# Patient Record
Sex: Male | Born: 1935 | Race: White | Hispanic: No | Marital: Married | Smoking: Former smoker
Health system: Southern US, Community
[De-identification: ages and names within clinical notes are randomized; demographics above are authoritative.]

## PROBLEM LIST (undated history)

## (undated) DIAGNOSIS — R918 Other nonspecific abnormal finding of lung field: Secondary | ICD-10-CM

## (undated) DIAGNOSIS — C801 Malignant (primary) neoplasm, unspecified: Secondary | ICD-10-CM

## (undated) DIAGNOSIS — C787 Secondary malignant neoplasm of liver and intrahepatic bile duct: Principal | ICD-10-CM

## (undated) DIAGNOSIS — F0390 Unspecified dementia without behavioral disturbance: Secondary | ICD-10-CM

## (undated) HISTORY — DX: Malignant (primary) neoplasm, unspecified: C80.1

## (undated) HISTORY — DX: Secondary malignant neoplasm of liver and intrahepatic bile duct: C78.7

## (undated) HISTORY — DX: Unspecified dementia, unspecified severity, without behavioral disturbance, psychotic disturbance, mood disturbance, and anxiety: F03.90

## (undated) HISTORY — DX: Other nonspecific abnormal finding of lung field: R91.8

## (undated) HISTORY — PX: CHOLECYSTECTOMY: SHX55

---

## 2000-02-24 ENCOUNTER — Emergency Department (HOSPITAL_COMMUNITY): Admission: EM | Admit: 2000-02-24 | Discharge: 2000-02-24 | Payer: Self-pay | Admitting: Emergency Medicine

## 2000-03-03 ENCOUNTER — Encounter (INDEPENDENT_AMBULATORY_CARE_PROVIDER_SITE_OTHER): Payer: Self-pay | Admitting: Specialist

## 2000-03-03 ENCOUNTER — Other Ambulatory Visit: Admission: RE | Admit: 2000-03-03 | Discharge: 2000-03-03 | Payer: Self-pay | Admitting: Urology

## 2000-03-13 ENCOUNTER — Emergency Department (HOSPITAL_COMMUNITY): Admission: EM | Admit: 2000-03-13 | Discharge: 2000-03-13 | Payer: Self-pay | Admitting: Emergency Medicine

## 2009-09-15 ENCOUNTER — Ambulatory Visit: Admission: RE | Admit: 2009-09-15 | Discharge: 2009-11-10 | Payer: Self-pay | Admitting: Radiation Oncology

## 2012-08-14 ENCOUNTER — Encounter: Payer: Self-pay | Admitting: Family Medicine

## 2012-08-14 DIAGNOSIS — F0390 Unspecified dementia without behavioral disturbance: Secondary | ICD-10-CM | POA: Insufficient documentation

## 2012-08-22 ENCOUNTER — Ambulatory Visit (INDEPENDENT_AMBULATORY_CARE_PROVIDER_SITE_OTHER): Payer: Medicare Other | Admitting: Family Medicine

## 2012-08-22 ENCOUNTER — Encounter: Payer: Self-pay | Admitting: Family Medicine

## 2012-08-22 VITALS — BP 130/72 | HR 84 | Temp 98.2°F | Resp 16 | Wt 179.0 lb

## 2012-08-22 DIAGNOSIS — F0391 Unspecified dementia with behavioral disturbance: Secondary | ICD-10-CM

## 2012-08-22 LAB — HEPATIC FUNCTION PANEL
ALT: 28 U/L (ref 0–53)
Alkaline Phosphatase: 74 U/L (ref 39–117)
Bilirubin, Direct: 0.1 mg/dL (ref 0.0–0.3)
Indirect Bilirubin: 0.4 mg/dL (ref 0.0–0.9)
Total Protein: 6.7 g/dL (ref 6.0–8.3)

## 2012-08-22 LAB — BASIC METABOLIC PANEL
CO2: 29 mEq/L (ref 19–32)
Calcium: 9.8 mg/dL (ref 8.4–10.5)
Chloride: 109 mEq/L (ref 96–112)
Sodium: 145 mEq/L (ref 135–145)

## 2012-08-22 LAB — CBC WITH DIFFERENTIAL/PLATELET
Lymphocytes Relative: 26 % (ref 12–46)
Lymphs Abs: 1.7 10*3/uL (ref 0.7–4.0)
Neutrophils Relative %: 67 % (ref 43–77)
Platelets: 195 10*3/uL (ref 150–400)
RBC: 5.02 MIL/uL (ref 4.22–5.81)
WBC: 6.6 10*3/uL (ref 4.0–10.5)

## 2012-08-22 NOTE — Progress Notes (Signed)
Subjective:     Patient ID: Adam Osborne, male   DOB: Feb 11, 1934, 79 y.o.   MRN: [id number]  HPI Patient presents with his wife and his daughter.  They're concerned because of an acute worsening and his dementia.  He he can no longer remember his children's names or his wife's name.  He cannot remember to eat or feed himself.  He has a hard time following directions.  He's been found using the bathroom outdoors.  He no longer knows the date, the time, or the context of the situation.   This is recently worsened.   He occasionally hallucinates people in house.  Wife also reports OCD tendencies regarding making his bed in his bath time ritual.  His wife has to help get ready in the mornings. She then leads to go to work where he remains at home unsupervised until lunchtime when his daughter comes by to feed him lunch. His wife actually home after lunchtime. The patient no longer drives and basically stays confined to the house when no one else is there.   Review of Systems  Constitutional: Negative for fever, chills, diaphoresis, activity change, appetite change, fatigue and unexpected weight change.  HENT: Negative.   Eyes: Negative.   Respiratory: Negative.   Cardiovascular: Negative.   Gastrointestinal: Negative.   Endocrine: Negative.   Genitourinary: Negative.   Neurological: Positive for speech difficulty. Negative for dizziness, tremors, seizures, syncope, facial asymmetry, weakness, light-headedness, numbness and headaches.  Hematological: Negative.   Psychiatric/Behavioral: Positive for hallucinations, confusion and decreased concentration. Negative for behavioral problems, sleep disturbance, self-injury, dysphoric mood and agitation. The patient is not nervous/anxious and is not hyperactive.        Objective:   Physical Exam  Constitutional: He appears well-developed and well-nourished. No distress.  HENT:  Head: Normocephalic and atraumatic.  Right Ear: External ear normal.   Left Ear: External ear normal.  Nose: Nose normal.  Mouth/Throat: Oropharynx is clear and moist.  Eyes: Conjunctivae and EOM are normal. Pupils are equal, round, and reactive to light.  Neck: Normal range of motion. Neck supple. No JVD present. No thyromegaly present.  Cardiovascular: Normal rate, regular rhythm and normal heart sounds.   No murmur heard. Pulmonary/Chest: Effort normal and breath sounds normal. No respiratory distress. He has no wheezes. He has no rales. He exhibits no tenderness.  Abdominal: Soft. Bowel sounds are normal. He exhibits no distension and no mass. There is no tenderness. There is no rebound and no guarding.  Musculoskeletal: Normal range of motion.  Lymphadenopathy:    He has no cervical adenopathy.  Neurological: He is alert. He has normal strength and normal reflexes. He is disoriented. He displays no atrophy and no tremor. No cranial nerve deficit or sensory deficit. He exhibits normal muscle tone. He displays a negative Romberg sign. Coordination and gait normal.  MMSE-9/30   Skin: He is not diaphoretic.       Assessment:     Moderate to severe dementia Subacute delirium    Plan:     Check a CBC, CMP, TSH B12. MRI of the brain Await results of the studies to determine the next step.  He may benefit from exelon patch.

## 2012-08-28 ENCOUNTER — Telehealth: Payer: Self-pay | Admitting: Family Medicine

## 2012-08-28 MED ORDER — DIAZEPAM 5 MG PO TABS: 5.0000 mg | ORAL_TABLET | ORAL | Status: DC

## 2012-08-28 NOTE — Telephone Encounter (Signed)
Can we call out valium 5 mg po x 1 30 min before mri

## 2012-08-28 NOTE — Telephone Encounter (Signed)
Med c/o pt's wife aware

## 2012-08-29 ENCOUNTER — Other Ambulatory Visit: Payer: Self-pay | Admitting: Family Medicine

## 2012-08-29 MED ORDER — DIAZEPAM 5 MG PO TABS: 5.0000 mg | ORAL_TABLET | ORAL | Status: DC

## 2012-09-01 ENCOUNTER — Inpatient Hospital Stay: Admission: RE | Admit: 2012-09-01 | Payer: Self-pay | Source: Ambulatory Visit

## 2012-09-06 ENCOUNTER — Telehealth: Payer: Self-pay | Admitting: Family Medicine

## 2012-09-07 MED ORDER — RIVASTIGMINE 4.6 MG/24HR TD PT24: 1.0000 | MEDICATED_PATCH | Freq: Every day | TRANSDERMAL | Status: AC

## 2012-09-07 MED ORDER — HALOPERIDOL 0.5 MG PO TABS: 0.5000 mg | ORAL_TABLET | Freq: Every evening | ORAL | Status: DC | PRN

## 2012-09-07 NOTE — Telephone Encounter (Signed)
Did he get MRI?  We could try haldol at night for agitation and start exelon patches to slow dementia.

## 2012-09-07 NOTE — Telephone Encounter (Signed)
Pt aware.

## 2012-09-08 ENCOUNTER — Ambulatory Visit
Admission: RE | Admit: 2012-09-08 | Discharge: 2012-09-08 | Disposition: A | Discharge status: Home / Self Care | Payer: Medicare Other | Source: Ambulatory Visit | Attending: Family Medicine | Admitting: Family Medicine

## 2012-09-08 MED ORDER — GADOBENATE DIMEGLUMINE 529 MG/ML IV SOLN
17.0000 mL | Freq: Once | INTRAVENOUS | Status: AC | PRN
  Administered 2012-09-08: 17 mL via INTRAVENOUS

## 2012-09-20 ENCOUNTER — Ambulatory Visit (INDEPENDENT_AMBULATORY_CARE_PROVIDER_SITE_OTHER): Payer: Medicare Other | Admitting: Family Medicine

## 2012-09-20 ENCOUNTER — Encounter: Payer: Self-pay | Admitting: Family Medicine

## 2012-09-20 VITALS — BP 130/74 | HR 78 | Temp 98.0°F | Resp 14 | Wt 177.0 lb

## 2012-09-20 DIAGNOSIS — M25552 Pain in left hip: Secondary | ICD-10-CM

## 2012-09-20 DIAGNOSIS — M25559 Pain in unspecified hip: Secondary | ICD-10-CM

## 2012-09-20 DIAGNOSIS — F0391 Unspecified dementia with behavioral disturbance: Secondary | ICD-10-CM

## 2012-09-20 MED ORDER — MELOXICAM 15 MG PO TABS: 15.0000 mg | ORAL_TABLET | Freq: Every day | ORAL | Status: DC

## 2012-09-20 NOTE — Progress Notes (Signed)
Subjective:     Patient ID: Adam Osborne, male   DOB: 28-Jan-1934, 79 y.o.   MRN: [id number]  Hip Pain  Pertinent negatives include no numbness.   Patient presents with his wife and his daughter.  They're concerned because of an acute worsening and his dementia.  He he can no longer remember his children's names or his wife's name.  He cannot remember to eat or feed himself.  He has a hard time following directions.  He's been found using the bathroom outdoors.  He no longer knows the date, the time, or the context of the situation.   This is recently worsened.   He occasionally hallucinates people in house.  Wife also reports OCD tendencies regarding making his bed in his bath time ritual.  His wife has to help get ready in the mornings. She then leads to go to work where he remains at home unsupervised until lunchtime when his daughter comes by to feed him lunch. His wife actually home after lunchtime. The patient no longer drives and basically stays confined to the house when no one else is there.   09/20/12 We ordered an MRI and some basic laboratory studies. MRI showed age-related atrophic changes but no CVA or brain tumor. Therefore I recommended transdermal exelon 4.6 mg/24-hour and Haldol 0.5 mg to be used when necessary for agitation or delirium. The right is very concerned about using Haldol. She is willing to try the Exelon. We did discuss his previous allergies to Aricept, but I emphasized that if we can prevent the dimentia from rapidly deteriorating, that gives her the best chance of keeping him at home.  Is also concerned the patient could be depressed. She states that his symptoms really worsen after the death of his brother.  He also complains of pain in his left hip with flexion. Review of Systems  Constitutional: Negative for fever, chills, diaphoresis, activity change, appetite change, fatigue and unexpected weight change.  HENT: Negative.   Eyes: Negative.   Respiratory:  Negative.   Cardiovascular: Negative.   Gastrointestinal: Negative.   Endocrine: Negative.   Genitourinary: Negative.   Neurological: Positive for speech difficulty. Negative for dizziness, tremors, seizures, syncope, facial asymmetry, weakness, light-headedness, numbness and headaches.  Hematological: Negative.   Psychiatric/Behavioral: Positive for hallucinations, confusion and decreased concentration. Negative for behavioral problems, sleep disturbance, self-injury, dysphoric mood and agitation. The patient is not nervous/anxious and is not hyperactive.        Objective:   Physical Exam  Constitutional: He appears well-developed and well-nourished. No distress.  HENT:  Head: Normocephalic and atraumatic.  Right Ear: External ear normal.  Left Ear: External ear normal.  Nose: Nose normal.  Mouth/Throat: Oropharynx is clear and moist.  Eyes: Conjunctivae and EOM are normal. Pupils are equal, round, and reactive to light.  Neck: Normal range of motion. Neck supple. No JVD present. No thyromegaly present.  Cardiovascular: Normal rate, regular rhythm and normal heart sounds.   No murmur heard. Pulmonary/Chest: Effort normal and breath sounds normal. No respiratory distress. He has no wheezes. He has no rales. He exhibits no tenderness.  Abdominal: Soft. Bowel sounds are normal. He exhibits no distension and no mass. There is no tenderness. There is no rebound and no guarding.  Musculoskeletal: Normal range of motion.  Lymphadenopathy:    He has no cervical adenopathy.  Neurological: He is alert. He has normal strength and normal reflexes. He is disoriented. He displays no atrophy and no tremor. No cranial nerve deficit  or sensory deficit. He exhibits normal muscle tone. He displays a negative Romberg sign. Coordination and gait normal.  MMSE-9/30   Skin: He is not diaphoretic.    on hip exam he has pain with Faber maneuver and tenderness to palpation of the greater trochanter bursa     Assessment:     Moderate to severe dementia Subacute delirium   Hip pain Plan:      Continue Namenda.  Exelon patch 4.6 mg per 24-hour transdermal.  I asked her to give one month to assess for any improvement. If symptoms do not improve we can consider empiric treatment for possible depression with an SSRI.  She is to call me with results. I will treat his hip pain empirically as arthritis with Mobic 15 mg by mouth daily. Given his history of prostate cancer, if the pain persists would recommend getting an x-ray to rule out possible bone mets.

## 2012-09-28 ENCOUNTER — Telehealth: Payer: Self-pay | Admitting: Family Medicine

## 2012-09-28 DIAGNOSIS — M25552 Pain in left hip: Secondary | ICD-10-CM

## 2012-09-28 NOTE — Telephone Encounter (Signed)
i put in the order for [facility] imaging

## 2012-09-28 NOTE — Telephone Encounter (Signed)
Patient aware.

## 2012-09-28 NOTE — Telephone Encounter (Signed)
Pt wife states meloxicam is not working for him anymore; she has taken him off of it and started on motrin. Wants to know if you want to do xrays like you have suggested before?

## 2012-09-29 ENCOUNTER — Telehealth: Payer: Self-pay | Admitting: Family Medicine

## 2012-09-29 MED ORDER — MEMANTINE HCL ER 28 MG PO CP24: 1.0000 | ORAL_CAPSULE | Freq: Every day | ORAL | Status: DC

## 2012-09-29 NOTE — Telephone Encounter (Signed)
Rx Refilled  

## 2012-09-29 NOTE — Telephone Encounter (Signed)
Pt wife states that drug company no longer provides regular namenda anymore but can provide Namenda XR in 7, 14, 21, 28 mg. Pt is taking Namenda 10 mg twice a day. What mg can we change it to?

## 2012-09-29 NOTE — Telephone Encounter (Signed)
namenda xr 28 mg poqday

## 2012-10-09 ENCOUNTER — Ambulatory Visit
Admission: RE | Admit: 2012-10-09 | Discharge: 2012-10-09 | Disposition: A | Discharge status: Home / Self Care | Payer: Medicare Other | Source: Ambulatory Visit | Attending: Family Medicine | Admitting: Family Medicine

## 2012-10-09 DIAGNOSIS — M25552 Pain in left hip: Secondary | ICD-10-CM

## 2012-10-10 NOTE — Progress Notes (Signed)
Wants to stay on Mobic d/t is working for pt and says pain is better now

## 2012-10-18 ENCOUNTER — Encounter: Payer: Self-pay | Admitting: Physician Assistant

## 2012-10-18 ENCOUNTER — Ambulatory Visit (INDEPENDENT_AMBULATORY_CARE_PROVIDER_SITE_OTHER): Payer: Medicare Other | Admitting: Physician Assistant

## 2012-10-18 VITALS — BP 134/70 | HR 80 | Temp 98.1°F | Resp 18 | Ht 69.75 in | Wt 176.0 lb

## 2012-10-18 DIAGNOSIS — W57XXXA Bitten or stung by nonvenomous insect and other nonvenomous arthropods, initial encounter: Secondary | ICD-10-CM

## 2012-10-18 MED ORDER — DOXYCYCLINE HYCLATE 100 MG PO TABS: 100.0000 mg | ORAL_TABLET | Freq: Two times a day (BID) | ORAL | Status: DC

## 2012-10-18 NOTE — Progress Notes (Signed)
Patient ID: Adam Osborne MRN: [id number], DOB: 06-Mar-1934, 79 y.o. Date of Encounter: 10/18/2012, 12:55 PM    Chief Complaint:  Chief Complaint  Patient presents with  . ?? bite mark on lower back     HPI: 79 y.o. year old male is here with his wife. He has dementia. She provides history. States that she noticed this area on his skin 4 days ago when she did his bath.  Says he goes outside a lot and had been outside the previous day. Thinks he had a tic there but she never saw a tic-thinks he had scratched it off. Area has gotten darker/ more pronounced so wanted to have it checked.  Has seen no other rash. He has had no fever. He has not c/o myalgias. He has had no malaise, not laying aroung with flu-like symptoms.   Home Meds: See attached medication section for any medications that were entered at today's visit. The computer does not put those onto this list.The following list is a list of meds entered prior to today's visit.   Current Outpatient Prescriptions on File Prior to Visit  Medication Sig Dispense Refill  . ibuprofen (ADVIL,MOTRIN) 600 MG tablet Take 600 mg by mouth every 6 (six) hours as needed for pain.      . Memantine HCl ER (NAMENDA XR) 28 MG CP24 Take 28 mg by mouth daily.  30 capsule  5  . Multiple Vitamins-Minerals (MULTIVITAMIN WITH MINERALS) tablet Take 1 tablet by mouth daily.      . rivastigmine (EXELON) 4.6 mg/24hr Place 1 patch (4.6 mg total) onto the skin daily.  30 patch  12  . haloperidol (HALDOL) 0.5 MG tablet Take 1 tablet (0.5 mg total) by mouth at bedtime as needed.  30 tablet  3  . meloxicam (MOBIC) 15 MG tablet Take 1 tablet (15 mg total) by mouth daily.  30 tablet  1   No current facility-administered medications on file prior to visit.    Allergies:  Allergies  Allergen Reactions  . Aricept (Donepezil Hcl)     Dizziness Vivid dreams       Review of Systems: See HPI for pertinent ROS. All other ROS negative.    Physical Exam: Blood  pressure 134/70, pulse 80, temperature 98.1 F (36.7 C), temperature source Oral, resp. rate 18, height 5' 9.75" (1.772 m), weight 176 lb (79.833 kg)., Body mass index is 25.42 kg/(m^2). General: WNWD WM.  Appears in no acute distress. He has dementia.  Lungs: Clear bilaterally to auscultation without wheezes, rales, or rhonchi. Breathing is unlabored. Heart: Regular rhythm. No murmurs, rubs, or gallops. Msk:  Strength and tone normal for age. Skin: Left low back at level of pant waist line: There is a 1 1/2 inch x 1 inch oval shape area of diffuse -dark erythema/slightly purplish. There is no target lesion. No area of clearing. In the center, there is a 3 mm scab area, c/w a bite mark.  No necrosis of skin. Palpation: no firmness/no abscess. No drainage. Site is flat, soft.      ASSESSMENT AND PLAN:  79 y.o. year old male with  1. Tick bite Probable tic bite. Rec start empiric treatment for Lyme. Discussed titers. Wife defers. Agrees to go ahead and start treatment. - doxycycline (VIBRA-TABS) 100 MG tablet; Take 1 tablet (100 mg total) by mouth 2 (two) times daily.  Dispense: 28 tablet; Refill: 0  F/U if develops further rash or any fever, myalgia, malaise.   Leanora Ivanoff  [place], [place], [facility] 10/18/2012 12:55 PM

## 2012-11-27 ENCOUNTER — Encounter: Payer: Self-pay | Admitting: Family Medicine

## 2012-11-27 ENCOUNTER — Ambulatory Visit (INDEPENDENT_AMBULATORY_CARE_PROVIDER_SITE_OTHER): Payer: Medicare Other | Admitting: Family Medicine

## 2012-11-27 VITALS — BP 122/60 | HR 68 | Temp 98.1°F | Resp 20 | Wt 175.0 lb

## 2012-11-27 DIAGNOSIS — F03918 Unspecified dementia, unspecified severity, with other behavioral disturbance: Secondary | ICD-10-CM

## 2012-11-27 DIAGNOSIS — F0391 Unspecified dementia with behavioral disturbance: Secondary | ICD-10-CM

## 2012-11-27 MED ORDER — QUETIAPINE FUMARATE 25 MG PO TABS: 25.0000 mg | ORAL_TABLET | Freq: Every day | ORAL | Status: DC

## 2012-11-27 NOTE — Patient Instructions (Signed)
Seroquel at bedtime for sleep and sundowning I prefer not to use valium due to risk of fall and dizziness Let us know if you have any concerns

## 2012-11-27 NOTE — Progress Notes (Signed)
  Subjective:    Patient ID: Adam Osborne, male    DOB: 06/26/1933, 79 y.o.   MRN: [id number]  HPI  Patient here with wife. He has a history of moderately to severe dementia he is currently on Namenda and Exelon patch. Wife is concerned because of sundowning and difficulty getting him to sleep. He has had 3 episodes of the past couple weeks where he is getting up in the middle of the night sitting on his close pressure in his feet trying to leave out the room. She has to redirect him however he would get undressed and then dress himself again 30 minutes to an hour later and he is not sleeping. He did get agitated with her however she was able to redirect him and he did not cause her any physical harm. During the day he does not seem to have any difficulties. He is still eating well and is doing well with his sitters to come twice a week. He was given a prescription for Haldol however she remembers her mother being on this medication and the side effects that may cause after discussing with the family decided not to give him this medication. She did inquire about the use of Valium.  Review of Systems - per above  unable to obtain complete GEN- denies fatigue, fever, weight loss,weakness, recent illness CVS- denies chest pain, palpitations RESP- denies SOB, cough, wheeze MSK- denies joint pain, muscle aches, injury Neuro- denies headache, dizziness, syncope, seizure activity       Objective:   Physical Exam  GEN- NAD, alert and oriented x 2 sitting quietly HEENT- PERRL, EOMI, non injected sclera, pink conjunctiva, MMM, oropharynx clear, wears hearing aide CVS- RRR, no murmur RESP-CTAB EXT- No edema Pulses- Radial, DP- 2+ Psych- responds to some questions, sitting quietly starting off, not agitated appearing       Assessment & Plan:

## 2012-11-28 NOTE — Assessment & Plan Note (Signed)
Now with some changes in behavior and sundowning, she has tried re-directing but he is getting agitated Wife fearful of use of haldol Will try seroquel at bedtime low dose

## 2013-01-31 ENCOUNTER — Telehealth: Payer: Self-pay | Admitting: Family Medicine

## 2013-01-31 NOTE — Telephone Encounter (Signed)
Namenda XR is on backorder. Can we call in something to replace it?

## 2013-02-01 NOTE — Telephone Encounter (Signed)
1 month of samples on my desk

## 2013-02-01 NOTE — Telephone Encounter (Signed)
pts wife aware.

## 2013-02-26 ENCOUNTER — Encounter: Payer: Self-pay | Admitting: Family Medicine

## 2013-02-26 ENCOUNTER — Ambulatory Visit (INDEPENDENT_AMBULATORY_CARE_PROVIDER_SITE_OTHER): Payer: Medicare Other | Admitting: Family Medicine

## 2013-02-26 VITALS — BP 130/60 | HR 98 | Temp 98.3°F | Resp 18 | Wt 186.0 lb

## 2013-02-26 DIAGNOSIS — F0391 Unspecified dementia with behavioral disturbance: Secondary | ICD-10-CM

## 2013-02-26 DIAGNOSIS — G2401 Drug induced subacute dyskinesia: Secondary | ICD-10-CM

## 2013-02-26 NOTE — Progress Notes (Signed)
Subjective:    Patient ID: Adam Osborne, male    DOB: Oct 23, 1933, 79 y.o.   MRN: [id number]  HPI  Patient is a very pleasant 79 year old white male is here today for followup. He has end-stage dementia. He is currently on Namenda XR 28 mg by mouth daily and exelon Patches 4.6 mg per 24 hours.  Due to agitation and sundowning we began Seroquel 25 mg by mouth each bedtime. Since starting the medication he is not as agitated and he is calmer at night. He is still not sleeping throughout the night. He roams the halls at the home. This keeps his wife awake.  She also thinks he's developing dry mouth from the medication and she is frequently licking his lips at night. However the way she describes the movements it sounds like possibly tardive dyskinesia. He's also developed a shuffling gait and a pill-rolling tremor at times. She's not sure if this is due to the medication. Past Medical History  Diagnosis Date  . Dementia   . Cancer     Gleason 6 prostate cancer  . Dementia    Current Outpatient Prescriptions on File Prior to Visit  Medication Sig Dispense Refill  . Memantine HCl ER (NAMENDA XR) 28 MG CP24 Take 28 mg by mouth daily.  30 capsule  5  . Multiple Vitamins-Minerals (MULTIVITAMIN WITH MINERALS) tablet Take 1 tablet by mouth daily.      . QUEtiapine (SEROQUEL) 25 MG tablet Take 1 tablet (25 mg total) by mouth at bedtime.  30 tablet  3  . rivastigmine (EXELON) 4.6 mg/24hr Place 1 patch (4.6 mg total) onto the skin daily.  30 patch  12   No current facility-administered medications on file prior to visit.   Allergies  Allergen Reactions  . Aricept [Donepezil Hcl]     Dizziness Vivid dreams    History   Social History  . Marital Status: Married    Spouse Name: N/A    Number of Children: N/A  . Years of Education: N/A   Occupational History  . Not on file.   Social History Main Topics  . Smoking status: Former Smoker -- 1.00 packs/day for 20 years  . Smokeless tobacco:  Never Used  . Alcohol Use: No  . Drug Use: Not on file  . Sexual Activity: Not on file   Other Topics Concern  . Not on file   Social History Narrative  . No narrative on file    Review of Systems  All other systems reviewed and are negative.       Objective:   Physical Exam  Vitals reviewed. Cardiovascular: Normal rate, regular rhythm and normal heart sounds.  Exam reveals no gallop and no friction rub.   No murmur heard. Pulmonary/Chest: Effort normal and breath sounds normal. No respiratory distress. He has no wheezes. He has no rales. He exhibits no tenderness.  Abdominal: Soft. Bowel sounds are normal. He exhibits no distension. There is no tenderness. There is no rebound and no guarding.  Neurological: He is alert. He displays normal reflexes. No cranial nerve deficit. He exhibits normal muscle tone. Coordination normal.          Assessment & Plan:  1. Dementia, with behavioral disturbance The biggest issue for the patient right now is his delirium at night and his sundowning. Therefore I increased his Zocor to 50 mg by mouth each bedtime. I anticipate that this will likely worsen his tardive dyskinesia. This may also worsen some of the Parkinsonian  features and he is developing. If symptoms continue to worsen, I would recommend a neurology consult.  Recheck in one month  2. Dyskinesia, tardive

## 2013-03-19 ENCOUNTER — Encounter: Payer: Self-pay | Admitting: Family Medicine

## 2013-03-19 ENCOUNTER — Ambulatory Visit (INDEPENDENT_AMBULATORY_CARE_PROVIDER_SITE_OTHER): Payer: Medicare Other | Admitting: Family Medicine

## 2013-03-19 VITALS — BP 144/74 | HR 76 | Temp 98.3°F | Resp 20 | Wt 185.0 lb

## 2013-03-19 DIAGNOSIS — F0391 Unspecified dementia with behavioral disturbance: Secondary | ICD-10-CM

## 2013-03-19 MED ORDER — QUETIAPINE FUMARATE 25 MG PO TABS: 100.0000 mg | ORAL_TABLET | Freq: Every day | ORAL | Status: DC

## 2013-03-19 NOTE — Progress Notes (Signed)
Subjective:    Patient ID: Adam Osborne, male    DOB: 1933/12/29, 79 y.o.   MRN: [id number]  HPI  02/24/13 Patient is a very pleasant 79 year old white male is here today for followup. He has end-stage dementia. He is currently on Namenda XR 28 mg by mouth daily and exelon Patches 4.6 mg per 24 hours.  Due to agitation and sundowning we began Seroquel 25 mg by mouth each bedtime. Since starting the medication he is not as agitated and he is calmer at night. He is still not sleeping throughout the night. He roams the halls at the home. This keeps his wife awake.  She also thinks he's developing dry mouth from the medication and she is frequently licking his lips at night. However the way she describes the movements it sounds like possibly tardive dyskinesia. He's also developed a shuffling gait and a pill-rolling tremor at times. She's not sure if this is due to the medication.  At that time, my plan was:  1. Dementia, with behavioral disturbance The biggest issue for the patient right now is his delirium at night and his sundowning. Therefore I increased his seroquel to 50 mg by mouth each bedtime. I anticipate that this will likely worsen his tardive dyskinesia. This may also worsen some of the Parkinsonian features and he is developing. If symptoms continue to worsen, I would recommend a neurology consult.  Recheck in one month 2. Dyskinesia, tardive  03/19/13 They're here today for followup.  The patient is not sleeping at night. His wife has been giving him 25 mg of Seroquel and then repeating it in an hour if he is not sleeping. This seems to calm him down some but not sufficient for him to rest. He experiences sundowning every night.  He wanders the halls and becomes agitated.   Past Medical History  Diagnosis Date  . Dementia   . Cancer     Gleason 6 prostate cancer  . Dementia    Current Outpatient Prescriptions on File Prior to Visit  Medication Sig Dispense Refill  . Memantine  HCl ER (NAMENDA XR) 28 MG CP24 Take 28 mg by mouth daily.  30 capsule  5  . Multiple Vitamins-Minerals (MULTIVITAMIN WITH MINERALS) tablet Take 1 tablet by mouth daily.      . QUEtiapine (SEROQUEL) 25 MG tablet Take 1 tablet (25 mg total) by mouth at bedtime.  30 tablet  3  . rivastigmine (EXELON) 4.6 mg/24hr Place 1 patch (4.6 mg total) onto the skin daily.  30 patch  12   No current facility-administered medications on file prior to visit.   Allergies  Allergen Reactions  . Aricept [Donepezil Hcl]     Dizziness Vivid dreams    History   Social History  . Marital Status: Married    Spouse Name: N/A    Number of Children: N/A  . Years of Education: N/A   Occupational History  . Not on file.   Social History Main Topics  . Smoking status: Former Smoker -- 1.00 packs/day for 20 years  . Smokeless tobacco: Never Used  . Alcohol Use: No  . Drug Use: Not on file  . Sexual Activity: Not on file   Other Topics Concern  . Not on file   Social History Narrative  . No narrative on file    Review of Systems  All other systems reviewed and are negative.       Objective:   Physical Exam  Vitals reviewed. Cardiovascular:  Normal rate, regular rhythm and normal heart sounds.  Exam reveals no gallop and no friction rub.   No murmur heard. Pulmonary/Chest: Effort normal and breath sounds normal. No respiratory distress. He has no wheezes. He has no rales. He exhibits no tenderness.  Abdominal: Soft. Bowel sounds are normal. He exhibits no distension. There is no tenderness. There is no rebound and no guarding.  Neurological: He is alert. He displays normal reflexes. No cranial nerve deficit. He exhibits normal muscle tone. Coordination normal.          Assessment & Plan:   1. Dementia, with behavioral disturbance I advised the patient to give seroquel 50 mg poqhs at one time.  If this does not help, increase to 100 mg poqhs.  If this does not help, I would discontinue  seroquel and try ativan instead.

## 2013-04-11 ENCOUNTER — Other Ambulatory Visit: Payer: Self-pay | Admitting: Family Medicine

## 2013-04-11 MED ORDER — MEMANTINE HCL ER 28 MG PO CP24: 1.0000 | ORAL_CAPSULE | Freq: Every day | ORAL | Status: DC

## 2013-04-11 NOTE — Telephone Encounter (Signed)
Rx Refilled  

## 2013-05-15 ENCOUNTER — Encounter (HOSPITAL_COMMUNITY): Payer: Self-pay | Admitting: Emergency Medicine

## 2013-05-15 ENCOUNTER — Emergency Department (HOSPITAL_COMMUNITY)
Admission: EM | Admit: 2013-05-15 | Discharge: 2013-05-15 | Disposition: A | Discharge status: Home / Self Care | Payer: Medicare Other | Attending: Emergency Medicine | Admitting: Emergency Medicine

## 2013-05-15 ENCOUNTER — Emergency Department (HOSPITAL_COMMUNITY): Payer: Medicare Other

## 2013-05-15 DIAGNOSIS — Z79899 Other long term (current) drug therapy: Secondary | ICD-10-CM | POA: Insufficient documentation

## 2013-05-15 DIAGNOSIS — F03918 Unspecified dementia, unspecified severity, with other behavioral disturbance: Secondary | ICD-10-CM | POA: Insufficient documentation

## 2013-05-15 DIAGNOSIS — F29 Unspecified psychosis not due to a substance or known physiological condition: Secondary | ICD-10-CM | POA: Insufficient documentation

## 2013-05-15 DIAGNOSIS — R350 Frequency of micturition: Secondary | ICD-10-CM | POA: Insufficient documentation

## 2013-05-15 DIAGNOSIS — Z87891 Personal history of nicotine dependence: Secondary | ICD-10-CM | POA: Insufficient documentation

## 2013-05-15 DIAGNOSIS — Z87440 Personal history of urinary (tract) infections: Secondary | ICD-10-CM | POA: Insufficient documentation

## 2013-05-15 DIAGNOSIS — Z8546 Personal history of malignant neoplasm of prostate: Secondary | ICD-10-CM | POA: Insufficient documentation

## 2013-05-15 DIAGNOSIS — F0391 Unspecified dementia with behavioral disturbance: Secondary | ICD-10-CM | POA: Insufficient documentation

## 2013-05-15 DIAGNOSIS — F0390 Unspecified dementia without behavioral disturbance: Secondary | ICD-10-CM

## 2013-05-15 DIAGNOSIS — N390 Urinary tract infection, site not specified: Secondary | ICD-10-CM

## 2013-05-15 LAB — BASIC METABOLIC PANEL
BUN: 19 mg/dL (ref 6–23)
Chloride: 102 mEq/L (ref 96–112)
GFR calc Af Amer: 70 mL/min — ABNORMAL LOW (ref >90)
Potassium: 4.2 mEq/L (ref 3.5–5.1)
Sodium: 137 mEq/L (ref 135–145)

## 2013-05-15 LAB — CBC WITH DIFFERENTIAL/PLATELET
Basophils Relative: 1 % (ref 0–1)
Hemoglobin: 13.3 g/dL (ref 13.0–17.0)
Lymphs Abs: 0.6 10*3/uL — ABNORMAL LOW (ref 0.7–4.0)
MCHC: 33.2 g/dL (ref 30.0–36.0)
Monocytes Relative: 9 % (ref 3–12)
Neutro Abs: 4.9 10*3/uL (ref 1.7–7.7)
Neutrophils Relative %: 80 % — ABNORMAL HIGH (ref 43–77)
Platelets: 130 10*3/uL — ABNORMAL LOW (ref 150–400)
RBC: 4.51 MIL/uL (ref 4.22–5.81)

## 2013-05-15 LAB — URINALYSIS, ROUTINE W REFLEX MICROSCOPIC
Bilirubin Urine: NEGATIVE
Leukocytes, UA: NEGATIVE
Nitrite: NEGATIVE
Specific Gravity, Urine: 1.026 (ref 1.005–1.030)
Urobilinogen, UA: 0.2 mg/dL (ref 0.0–1.0)
pH: 5.5 (ref 5.0–8.0)

## 2013-05-15 LAB — URINE MICROSCOPIC-ADD ON

## 2013-05-15 MED ORDER — LIDOCAINE HCL (PF) 1 % IJ SOLN
INTRAMUSCULAR | Status: AC
  Administered 2013-05-15: 11:00:00
  Filled 2013-05-15: qty 5

## 2013-05-15 MED ORDER — CEFTRIAXONE SODIUM 1 G IJ SOLR
1.0000 g | Freq: Once | INTRAMUSCULAR | Status: AC
  Administered 2013-05-15: 1 g via INTRAMUSCULAR
  Filled 2013-05-15: qty 10

## 2013-05-15 MED ORDER — CEFUROXIME AXETIL 500 MG PO TABS: 500.0000 mg | ORAL_TABLET | Freq: Two times a day (BID) | ORAL | Status: DC

## 2013-05-15 NOTE — Progress Notes (Signed)
ED CM in room to f/u with [facility] needs.Pt presented to ED with AMS hx of dementia.  Pt lives at home with wife as primary caregiver with  family support . List provided. Family also requesting 3:1 chair, Choice was given, and  family decided on [facility]. HH orders written and referral placed for RN , PT,OT, and SW.  Discussed care goals with patient's family. Family agrees with discharge plan.  Informed patient's family that someone from [facility] will contact them at number provided in record to set up the visit 24- 48 hrs.[facility]  number given to family to contact for any additional questions  or concerns. Bedside commode was delivered by [facility] to room prior to discharge.  Verbalized understanding employed the teach back method. No further ED CM needs Identified

## 2013-05-15 NOTE — ED Notes (Signed)
Patient transported to CT 

## 2013-05-15 NOTE — ED Notes (Signed)
Care management called, bedside commode will be deilvered in 15 minutes to room.

## 2013-05-15 NOTE — Progress Notes (Signed)
Case Manager Consult for possible home health needs.Patient has AMS.Spoke with wife and daughter.Family report patient more confused and urine output is concentrated. CM education provided on Home health services choice list.Family are yet to elect a home health provider and report they don't have grab rails at home or any DME. CM provided Assurance that DME services can provide help if needed.CM spoke with Dr Blinda Leatherwood who is aware family are deciding on the current home health plan.

## 2013-05-15 NOTE — ED Notes (Signed)
Pt arrived by [facility] from home. Has hx of dementia. Family reports increase in confusion since yesterday, frequent urination and strong odor to urine.

## 2013-05-15 NOTE — ED Provider Notes (Signed)
CSN: [id number]     Arrival date & time 05/15/13  0714 History   First MD Initiated Contact with Patient 05/15/13 0720     Chief Complaint  Patient presents with  . Altered Mental Status   (Consider location/radiation/quality/duration/timing/severity/associated sxs/prior Treatment) HPI Comments: Patient brought to the emergency department by EMS from home. Patient has a history of dementia, normally not oriented, but has been more confused than usual since yesterday. Family reports they have also noticed urinary frequency, incontinence, dark urine with foul odor. He does have a history of prostate cancer and has had urinary retention in the past. They have not noticed any fever. Has not had cough, shortness of breath, nausea, vomiting, diarrhea. At arrival, patient is awake and alert, does not answer questions appropriately. Daughter present at the bedside, has a vital information.  Patient is a 79 y.o. male presenting with altered mental status.  Altered Mental Status Presenting symptoms: confusion   Associated symptoms: no fever     Past Medical History  Diagnosis Date  . Dementia   . Cancer     Gleason 6 prostate cancer  . Dementia    Past Surgical History  Procedure Laterality Date  . Cholecystectomy     Family History  Problem Relation Age of Onset  . Cancer Mother     lung  . Dementia Sister    History  Substance Use Topics  . Smoking status: Former Smoker -- 1.00 packs/day for 20 years  . Smokeless tobacco: Never Used  . Alcohol Use: No    Review of Systems  Constitutional: Negative for fever.  Genitourinary: Positive for frequency.  Psychiatric/Behavioral: Positive for confusion.  All other systems reviewed and are negative.    Allergies  Aricept  Home Medications   Current Outpatient Rx  Name  Route  Sig  Dispense  Refill  . Memantine HCl ER (NAMENDA XR) 28 MG CP24   Oral   Take 28 mg by mouth daily.   30 capsule   5   . Multiple  Vitamins-Minerals (MULTIVITAMIN WITH MINERALS) tablet   Oral   Take 1 tablet by mouth daily.         . QUEtiapine (SEROQUEL) 25 MG tablet   Oral   Take 1 tablet (25 mg total) by mouth at bedtime.   30 tablet   3   . QUEtiapine (SEROQUEL) 25 MG tablet   Oral   Take 4 tablets (100 mg total) by mouth at bedtime.   120 tablet   1   . rivastigmine (EXELON) 4.6 mg/24hr   Transdermal   Place 1 patch (4.6 mg total) onto the skin daily.   30 patch   12    SpO2 96% Physical Exam  Constitutional: He appears well-developed and well-nourished. No distress.  HENT:  Head: Normocephalic and atraumatic.  Right Ear: Hearing normal.  Left Ear: Hearing normal.  Nose: Nose normal.  Mouth/Throat: Oropharynx is clear and moist and mucous membranes are normal.  Eyes: Conjunctivae and EOM are normal. Pupils are equal, round, and reactive to light.  Neck: Normal range of motion. Neck supple.  Cardiovascular: Regular rhythm, S1 normal and S2 normal.  Exam reveals no gallop and no friction rub.   No murmur heard. Pulmonary/Chest: Effort normal and breath sounds normal. No respiratory distress. He exhibits no tenderness.  Abdominal: Soft. Normal appearance and bowel sounds are normal. There is no hepatosplenomegaly. There is no tenderness. There is no rebound, no guarding, no tenderness at McBurney's point and  negative Murphy's sign. No hernia.  Musculoskeletal: Normal range of motion.  Neurological: He is alert. He has normal strength. He is disoriented. No cranial nerve deficit or sensory deficit. Coordination normal. GCS eye subscore is 4. GCS verbal subscore is 4. GCS motor subscore is 6.  Skin: Skin is warm, dry and intact. No rash noted. No cyanosis.  Psychiatric: He has a normal mood and affect. His speech is normal and behavior is normal. Thought content normal.    ED Course  Procedures (including critical care time) Labs Review Labs Reviewed  CBC WITH DIFFERENTIAL - Abnormal; Notable  for the following:    Platelets 130 (*)    Neutrophils Relative % 80 (*)    Lymphocytes Relative 10 (*)    Lymphs Abs 0.6 (*)    All other components within normal limits  BASIC METABOLIC PANEL - Abnormal; Notable for the following:    Glucose, Bld 139 (*)    GFR calc non Af Amer 61 (*)    GFR calc Af Amer 70 (*)    All other components within normal limits  URINALYSIS, ROUTINE W REFLEX MICROSCOPIC - Abnormal; Notable for the following:    APPearance CLOUDY (*)    Glucose, UA 100 (*)    Hgb urine dipstick LARGE (*)    Protein, ur 100 (*)    All other components within normal limits  URINE MICROSCOPIC-ADD ON   Imaging Review Ct Head Wo Contrast  05/15/2013   CLINICAL DATA:  Altered mental status  EXAM: CT HEAD WITHOUT CONTRAST  TECHNIQUE: Contiguous axial images were obtained from the base of the skull through the vertex without intravenous contrast.  COMPARISON:  09/08/2012  FINDINGS: No skull fracture is noted. Motion artifacts are noted. Moderate cerebral atrophy. No intracranial hemorrhage, mass effect or midline shift. Periventricular and patchy subcortical white matter decreased attenuation probable due to chronic small vessel ischemic changes. No acute cortical infarction. No mass lesion is noted on this unenhanced scan.  IMPRESSION: Moderate cerebral atrophy. No acute intracranial abnormality. Periventricular and patchy subcortical white matter decreased attenuation probable due to chronic small vessel ischemic changes.   Electronically Signed   By: Natasha Mead M.D.   On: 05/15/2013 11:09    EKG Interpretation   None       MDM  Diagnosis: 1. Mental status changes 2. Dementia 3. Possible UTI  Patient presents to the ER for evaluation of acute mental status changes in the setting of urinary frequency, dark and malodorous urine with incontinence. Family reports previous symptoms with urinary tract infection. Workup here in the ER, however, was fairly unremarkable. Urinalysis was  equivocal. This does not rule out infection, will treat empirically. Family will take the patient home, case management consult has been placed to help with home health needs.    Gilda Crease, MD 05/15/13 1201

## 2013-06-08 ENCOUNTER — Ambulatory Visit (INDEPENDENT_AMBULATORY_CARE_PROVIDER_SITE_OTHER): Payer: Medicare Other | Admitting: Family Medicine

## 2013-06-08 ENCOUNTER — Encounter: Payer: Self-pay | Admitting: Family Medicine

## 2013-06-08 VITALS — BP 130/74 | HR 106 | Temp 97.1°F | Resp 16 | Ht 69.75 in | Wt 185.0 lb

## 2013-06-08 DIAGNOSIS — F0391 Unspecified dementia with behavioral disturbance: Secondary | ICD-10-CM

## 2013-06-08 DIAGNOSIS — R319 Hematuria, unspecified: Secondary | ICD-10-CM

## 2013-06-08 DIAGNOSIS — F03918 Unspecified dementia, unspecified severity, with other behavioral disturbance: Secondary | ICD-10-CM

## 2013-06-08 LAB — URINALYSIS, ROUTINE W REFLEX MICROSCOPIC
Bilirubin Urine: NEGATIVE
GLUCOSE, UA: NEGATIVE mg/dL
Ketones, ur: NEGATIVE mg/dL
Nitrite: NEGATIVE
PH: 6 (ref 5.0–8.0)
Protein, ur: NEGATIVE mg/dL
SPECIFIC GRAVITY, URINE: 1.02 (ref 1.005–1.030)
Urobilinogen, UA: 0.2 mg/dL (ref 0.0–1.0)

## 2013-06-08 LAB — URINALYSIS, MICROSCOPIC ONLY: CRYSTALS: NONE SEEN

## 2013-06-08 NOTE — Progress Notes (Signed)
   Subjective:    Patient ID: Adam Osborne, male    DOB: 1934/03/09, 80 y.o.   MRN: [id number]  HPI  Patient has end stage lzheimer's disease on exelon and namenda.  He uses seroquel 25-100 mg poqhs as needed for anxiety and agitation.  He is recently more agitated and confused.  He is pacing the floors at night.  He will not sleep.  He is more restless.  This is causing tremendous stress on his wife in caring for him at home.  She would like to check him for UTI. Past Medical History  Diagnosis Date  . Dementia   . Cancer     Gleason 6 prostate cancer  . Dementia    Current Outpatient Prescriptions on File Prior to Visit  Medication Sig Dispense Refill  . ibuprofen (ADVIL,MOTRIN) 200 MG tablet Take 400 mg by mouth every 6 (six) hours as needed for fever or moderate pain.      . Memantine HCl ER (NAMENDA XR) 28 MG CP24 Take 28 mg by mouth daily.  30 capsule  5  . OVER THE COUNTER MEDICATION Take 2 capsules by mouth daily.      . QUEtiapine (SEROQUEL) 25 MG tablet Take 50 mg by mouth at bedtime.      . rivastigmine (EXELON) 4.6 mg/24hr Place 1 patch (4.6 mg total) onto the skin daily.  30 patch  12   No current facility-administered medications on file prior to visit.   Allergies  Allergen Reactions  . Aricept [Donepezil Hcl]     Dizziness Vivid dreams    History   Social History  . Marital Status: Married    Spouse Name: N/A    Number of Children: N/A  . Years of Education: N/A   Occupational History  . Not on file.   Social History Main Topics  . Smoking status: Former Smoker -- 1.00 packs/day for 20 years  . Smokeless tobacco: Never Used  . Alcohol Use: No  . Drug Use: Not on file  . Sexual Activity: Not on file   Other Topics Concern  . Not on file   Social History Narrative  . No narrative on file     Review of Systems  All other systems reviewed and are negative.       Objective:   Physical Exam  Vitals reviewed. Cardiovascular: Normal rate and  regular rhythm.   Pulmonary/Chest: Effort normal and breath sounds normal.  Patient is alert but non communicative and sits quietly in chair.          Assessment & Plan:  1. Blood in urine UA does not show infection.  Check urine cx. - Urinalysis, Routine w reflex microscopic - Urine culture  2. Dementia with behavioral disturbance I suggested switching to seroquel xr 150 mg poqhs.  Wife will consider and discuss with his daughters and then let me know.  She may need to consider placement in SNF.

## 2013-06-09 LAB — URINE CULTURE
Colony Count: NO GROWTH
ORGANISM ID, BACTERIA: NO GROWTH

## 2013-06-13 ENCOUNTER — Telehealth: Payer: Self-pay | Admitting: *Deleted

## 2013-06-14 MED ORDER — HYDROCORTISONE ACETATE 25 MG RE SUPP: 25.0000 mg | Freq: Two times a day (BID) | RECTAL | Status: AC

## 2013-06-14 NOTE — Telephone Encounter (Signed)
Caregiver aware of RX

## 2013-06-14 NOTE — Telephone Encounter (Signed)
anusol hc supp pr bid for 10 days.

## 2013-06-20 ENCOUNTER — Encounter: Payer: Self-pay | Admitting: Family Medicine

## 2013-06-20 NOTE — Telephone Encounter (Signed)
This encounter was created in error - please disregard.

## 2013-06-20 NOTE — Telephone Encounter (Signed)
Call back number is [place] is needing to speak to you real quick about some verbal orders about the patient

## 2013-06-22 ENCOUNTER — Ambulatory Visit (INDEPENDENT_AMBULATORY_CARE_PROVIDER_SITE_OTHER): Payer: Medicare Other | Admitting: Family Medicine

## 2013-06-22 ENCOUNTER — Encounter: Payer: Self-pay | Admitting: Family Medicine

## 2013-06-22 ENCOUNTER — Other Ambulatory Visit: Payer: Self-pay | Admitting: Family Medicine

## 2013-06-22 ENCOUNTER — Ambulatory Visit
Admission: RE | Admit: 2013-06-22 | Discharge: 2013-06-22 | Disposition: A | Discharge status: Home / Self Care | Payer: Medicare Other | Source: Ambulatory Visit | Attending: Family Medicine | Admitting: Family Medicine

## 2013-06-22 ENCOUNTER — Telehealth: Payer: Self-pay | Admitting: Family Medicine

## 2013-06-22 VITALS — BP 110/68 | HR 82 | Temp 98.0°F | Resp 16 | Ht 69.75 in | Wt 183.0 lb

## 2013-06-22 DIAGNOSIS — R350 Frequency of micturition: Secondary | ICD-10-CM

## 2013-06-22 DIAGNOSIS — J189 Pneumonia, unspecified organism: Secondary | ICD-10-CM

## 2013-06-22 DIAGNOSIS — R112 Nausea with vomiting, unspecified: Secondary | ICD-10-CM

## 2013-06-22 LAB — URINALYSIS, ROUTINE W REFLEX MICROSCOPIC
GLUCOSE, UA: NEGATIVE mg/dL
Hgb urine dipstick: NEGATIVE
KETONES UR: NEGATIVE mg/dL
Leukocytes, UA: NEGATIVE
NITRITE: NEGATIVE
PH: 6 (ref 5.0–8.0)
Protein, ur: 30 mg/dL — AB
Urobilinogen, UA: 0.2 mg/dL (ref 0.0–1.0)

## 2013-06-22 LAB — URINALYSIS, MICROSCOPIC ONLY
CASTS: NONE SEEN
CRYSTALS: NONE SEEN

## 2013-06-22 MED ORDER — LEVOFLOXACIN 750 MG PO TABS: 750.0000 mg | ORAL_TABLET | Freq: Every day | ORAL | Status: DC

## 2013-06-22 NOTE — Progress Notes (Signed)
Subjective:    Patient ID: Adam Osborne, male    DOB: 1933-06-15, 80 y.o.   MRN: [id number]  HPI A few days ago, the patient became choked while eating a meal. His sitter had to remove a piece of meat using her finger. There is some concern he may have aspirated. Over the last few days he developed a low-grade fever to 99. He had one episode of vomiting associated with clear mucus. He also is complaining of tenderness in his right upper quadrant and left upper quadrant.  His wife denies any coughing. There is no hemoptysis. There is no rhinorrhea or sore throat. There is no rash. The patient has not been constipated. His urinalysis today is benign. Past Medical History  Diagnosis Date  . Dementia   . Cancer     Gleason 6 prostate cancer  . Dementia    Current Outpatient Prescriptions on File Prior to Visit  Medication Sig Dispense Refill  . hydrocortisone (ANUSOL-HC) 25 MG suppository Place 1 suppository (25 mg total) rectally 2 (two) times daily.  20 suppository  0  . ibuprofen (ADVIL,MOTRIN) 200 MG tablet Take 400 mg by mouth every 6 (six) hours as needed for fever or moderate pain.      . Memantine HCl ER (NAMENDA XR) 28 MG CP24 Take 28 mg by mouth daily.  30 capsule  5  . OVER THE COUNTER MEDICATION Take 2 capsules by mouth daily.      . QUEtiapine (SEROQUEL) 25 MG tablet Take 50 mg by mouth at bedtime.      . rivastigmine (EXELON) 4.6 mg/24hr Place 1 patch (4.6 mg total) onto the skin daily.  30 patch  12   No current facility-administered medications on file prior to visit.   Allergies  Allergen Reactions  . Aricept [Donepezil Hcl]     Dizziness Vivid dreams    History   Social History  . Marital Status: Married    Spouse Name: N/A    Number of Children: N/A  . Years of Education: N/A   Occupational History  . Not on file.   Social History Main Topics  . Smoking status: Former Smoker -- 1.00 packs/day for 20 years  . Smokeless tobacco: Never Used  . Alcohol Use:  No  . Drug Use: Not on file  . Sexual Activity: Not on file   Other Topics Concern  . Not on file   Social History Narrative  . No narrative on file      Review of Systems  All other systems reviewed and are negative.       Objective:   Physical Exam  Vitals reviewed. Constitutional: He appears well-developed and well-nourished.  HENT:  Mouth/Throat: Oropharynx is clear and moist. No oropharyngeal exudate.  Eyes: Conjunctivae are normal. No scleral icterus.  Neck: Neck supple.  Cardiovascular: Normal rate, regular rhythm and normal heart sounds.   No murmur heard. Pulmonary/Chest: Effort normal. He has decreased breath sounds. He has no wheezes. He has no rhonchi. He has no rales.  Abdominal: Soft. Bowel sounds are normal. He exhibits no distension. There is no tenderness. There is no rebound and no guarding.  Lymphadenopathy:    He has no cervical adenopathy.   patient is mildly tender to palpation in the right upper quadrant and left upper quadrant around his lower ribs. He is breathing very shallow. I am unable to appreciate any fluid in his lungs on examination.        Assessment & Plan:  1. Frequent urination Urinalysis today reveals no nitrites or leukocyte esterase. There is not they seem to be any signs of urinary tract - Urinalysis, Routine w reflex microscopic  2. Nausea with vomiting I am concerned about aspiration pneumonia versus possible ileus/small bowel obstruction. I will obtain a chest x-ray as well as a two-view of the abdomen.  IVC chest x-ray shows pneumonia on begin treatment with antibiotics. Angina rule out bowel obstruction/ileus/impaction/constipation with abdominal x-ray.  If the above studies are negative, the patient may simply be suffering from a viral gastroenteritis. - DG Chest 2 View; Future - DG Abd 2 Views; Future

## 2013-06-22 NOTE — Telephone Encounter (Signed)
Told to stop Seroquel while on antibiotic for pneumonia.  What is she suppose to do with him at night to help him sleep with his dementia???

## 2013-06-22 NOTE — Telephone Encounter (Signed)
He can take 1 seroquel at night but not 3-4

## 2013-06-22 NOTE — Telephone Encounter (Signed)
Left wife mess with provider recommendation

## 2013-06-28 ENCOUNTER — Telehealth: Payer: Self-pay | Admitting: *Deleted

## 2013-06-28 NOTE — Telephone Encounter (Signed)
Pt wife called stating that pt is throwing up white mucus, was recently diagnosed with pneumonia has just recently eat yogurt and cheerios and on antibiotic wife is concerned something is wrong and wants to know what to do. I spoke with Dr. Dennard Schaumann and he stated to stop the antibiotic and if he is profusely throw up he needs to be seen.

## 2013-06-29 ENCOUNTER — Encounter: Payer: Self-pay | Admitting: Family Medicine

## 2013-06-29 ENCOUNTER — Ambulatory Visit (INDEPENDENT_AMBULATORY_CARE_PROVIDER_SITE_OTHER): Payer: Medicare Other | Admitting: Family Medicine

## 2013-06-29 VITALS — BP 132/60 | HR 100 | Temp 97.9°F | Resp 12 | Ht 69.75 in | Wt 181.0 lb

## 2013-06-29 DIAGNOSIS — R112 Nausea with vomiting, unspecified: Secondary | ICD-10-CM

## 2013-06-29 LAB — CBC WITH DIFFERENTIAL/PLATELET
Basophils Absolute: 0 10*3/uL (ref 0.0–0.1)
Basophils Relative: 1 % (ref 0–1)
Eosinophils Absolute: 0.1 10*3/uL (ref 0.0–0.7)
Eosinophils Relative: 1 % (ref 0–5)
HCT: 38.7 % — ABNORMAL LOW (ref 39.0–52.0)
HEMOGLOBIN: 13 g/dL (ref 13.0–17.0)
LYMPHS ABS: 1.6 10*3/uL (ref 0.7–4.0)
LYMPHS PCT: 20 % (ref 12–46)
MCH: 28.6 pg (ref 26.0–34.0)
MCHC: 33.6 g/dL (ref 30.0–36.0)
MCV: 85.1 fL (ref 78.0–100.0)
MONOS PCT: 9 % (ref 3–12)
Monocytes Absolute: 0.7 10*3/uL (ref 0.1–1.0)
NEUTROS ABS: 5.6 10*3/uL (ref 1.7–7.7)
NEUTROS PCT: 69 % (ref 43–77)
Platelets: 214 10*3/uL (ref 150–400)
RBC: 4.55 MIL/uL (ref 4.22–5.81)
RDW: 14.6 % (ref 11.5–15.5)
WBC: 8.1 10*3/uL (ref 4.0–10.5)

## 2013-06-29 LAB — COMPLETE METABOLIC PANEL WITH GFR
ALT: 122 U/L — AB (ref 0–53)
AST: 205 U/L — ABNORMAL HIGH (ref 0–37)
Albumin: 3.6 g/dL (ref 3.5–5.2)
Alkaline Phosphatase: 326 U/L — ABNORMAL HIGH (ref 39–117)
BILIRUBIN TOTAL: 1.6 mg/dL — AB (ref 0.2–1.2)
BUN: 15 mg/dL (ref 6–23)
CO2: 27 meq/L (ref 19–32)
CREATININE: 0.97 mg/dL (ref 0.50–1.35)
Calcium: 9.1 mg/dL (ref 8.4–10.5)
Chloride: 100 mEq/L (ref 96–112)
GFR, EST AFRICAN AMERICAN: 87 mL/min
GFR, EST NON AFRICAN AMERICAN: 75 mL/min
Glucose, Bld: 99 mg/dL (ref 70–99)
Potassium: 4.6 mEq/L (ref 3.5–5.3)
Sodium: 136 mEq/L (ref 135–145)
Total Protein: 6.6 g/dL (ref 6.0–8.3)

## 2013-06-29 NOTE — Progress Notes (Signed)
Subjective:    Patient ID: Adam Osborne, male    DOB: 01-24-1934, 80 y.o.   MRN: [id number]  HPI 06/22/13 A few days ago, the patient became choked while eating a meal. His sitter had to remove a piece of meat using her finger. There is some concern he may have aspirated. Over the last few days he developed a low-grade fever to 99. He had one episode of vomiting associated with clear mucus. He also is complaining of tenderness in his right upper quadrant and left upper quadrant.  His wife denies any coughing. There is no hemoptysis. There is no rhinorrhea or sore throat. There is no rash. The patient has not been constipated. His urinalysis today is benign.  At that time, my plan was: 1. Frequent urination Urinalysis today reveals no nitrites or leukocyte esterase. There is not they seem to be any signs of urinary tract - Urinalysis, Routine w reflex microscopic  2. Nausea with vomiting I am concerned about aspiration pneumonia versus possible ileus/small bowel obstruction. I will obtain a chest x-ray as well as a two-view of the abdomen.  IVC chest x-ray shows pneumonia on begin treatment with antibiotics. Angina rule out bowel obstruction/ileus/impaction/constipation with abdominal x-ray.  If the above studies are negative, the patient may simply be suffering from a viral gastroenteritis. - DG Chest 2 View; Future - DG Abd 2 Views; Future  The x-ray results were: Findings suspicious for right subpulmonic pleural effusion, moderate  in size. Right base atelectasis or infiltrate. Left nephrolithiasis.  No obstruction or free air.  Right basilar opacity, likely combination of effusion and  atelectasis/infiltrate as seen on chest x-ray.  The patient was subsequent started on Levaquin 750 mg by mouth daily for 10 days.  He has had no further cough. He has had no fever. However he continues to complain about epigastric abdominal pain and discomfort. 2 days ago, the patient had an episode of  vomiting. Shortly thereafter he slumped over in his chair and was unresponsive. This was not witnessed by the family but was relayed by the sitter. By the time the family arrived, the patient was responsive. There was no witnessed seizure activity. No witnesses are  available to explain what happened in the event.  It sounds as if the patient may have had an episode of vasovagal syncope after vomiting. EMS was called and his vital signs were normal. Furthermore the family states the patient was acting normally thereafter. They're here today for reevaluation. At present the patient is sitting comfortably in his chair with no problems. Past Medical History  Diagnosis Date  . Dementia   . Cancer     Gleason 6 prostate cancer  . Dementia    Current Outpatient Prescriptions on File Prior to Visit  Medication Sig Dispense Refill  . hydrocortisone (ANUSOL-HC) 25 MG suppository Place 1 suppository (25 mg total) rectally 2 (two) times daily.  20 suppository  0  . ibuprofen (ADVIL,MOTRIN) 200 MG tablet Take 400 mg by mouth every 6 (six) hours as needed for fever or moderate pain.      Marland Kitchen levofloxacin (LEVAQUIN) 750 MG tablet Take 1 tablet (750 mg total) by mouth daily.  10 tablet  0  . Memantine HCl ER (NAMENDA XR) 28 MG CP24 Take 28 mg by mouth daily.  30 capsule  5  . OVER THE COUNTER MEDICATION Take 2 capsules by mouth daily.      . QUEtiapine (SEROQUEL) 25 MG tablet Take 50 mg by mouth  at bedtime.      . rivastigmine (EXELON) 4.6 mg/24hr Place 1 patch (4.6 mg total) onto the skin daily.  30 patch  12   No current facility-administered medications on file prior to visit.   Allergies  Allergen Reactions  . Aricept [Donepezil Hcl]     Dizziness Vivid dreams    History   Social History  . Marital Status: Married    Spouse Name: N/A    Number of Children: N/A  . Years of Education: N/A   Occupational History  . Not on file.   Social History Main Topics  . Smoking status: Former Smoker --  1.00 packs/day for 20 years  . Smokeless tobacco: Never Used  . Alcohol Use: No  . Drug Use: Not on file  . Sexual Activity: Not on file   Other Topics Concern  . Not on file   Social History Narrative  . No narrative on file      Review of Systems  All other systems reviewed and are negative.       Objective:   Physical Exam  Vitals reviewed. Constitutional: He appears well-developed and well-nourished.  HENT:  Mouth/Throat: Oropharynx is clear and moist. No oropharyngeal exudate.  Eyes: Conjunctivae are normal. No scleral icterus.  Neck: Neck supple.  Cardiovascular: Normal rate, regular rhythm and normal heart sounds.   No murmur heard. Pulmonary/Chest: Effort normal. He has decreased breath sounds. He has no wheezes. He has no rhonchi. He has no rales.  Abdominal: Soft. Bowel sounds are normal. He exhibits no distension. There is no tenderness. There is no rebound and no guarding.  Lymphadenopathy:    He has no cervical adenopathy.   patient is mildly tender to palpation in the right upper quadrant and left upper quadrant around his lower ribs. He is breathing very shallow. I am unable to appreciate any fluid in his lungs on examination.        Assessment & Plan:  Nausea with vomiting - Plan: COMPLETE METABOLIC PANEL WITH GFR, CBC with Differential  Clinically there is no evidence of pneumonia on his examination today, his breath sounds are normal and the right lower lobe. Concern that the antibiotic may be upsetting his stomach causing the vomiting. Therefore I've asked him to discontinue Levaquin. I like the family to watch him over the next few days. If the nausea and vomiting subsided no other symptoms develop, I believe the patient needs no further workup. It could have been an episode of vasovagal syncope related to vomiting. If patient has any more syncopal episodes, recommend going to the hospital. At the bare minimum I would recommend a 24-hour Holter monitor,  echocardiogram of heart, and possibly an EEG to evaluate for seizures.  If the patient continues to experience epigastric abdominal discomfort and vomiting, I recommend a swallowing study to evaluate for stricture or aspiration, a CT scan of the abdomen and pelvis, and possibly a GI consultation for an EGD depending on the severity of his symptoms.  The family is comfortable with this plan. We will monitor the patient clinically over the weekend. Any further syncopal episodes or nausea and vomiting will prompt a workup.

## 2013-07-02 ENCOUNTER — Other Ambulatory Visit: Payer: Self-pay | Admitting: Family Medicine

## 2013-07-02 DIAGNOSIS — R945 Abnormal results of liver function studies: Principal | ICD-10-CM

## 2013-07-02 DIAGNOSIS — R7989 Other specified abnormal findings of blood chemistry: Secondary | ICD-10-CM

## 2013-07-03 ENCOUNTER — Telehealth: Payer: Self-pay | Admitting: Family Medicine

## 2013-07-03 ENCOUNTER — Other Ambulatory Visit: Payer: Medicare Other

## 2013-07-03 MED ORDER — DIAZEPAM 5 MG PO TABS
5.0000 mg | ORAL_TABLET | Freq: Once | ORAL | Status: DC
Start: 2013-07-03 — End: 2013-07-19

## 2013-07-03 NOTE — Telephone Encounter (Signed)
Called patient home and gave caregiver order for one time Valium.  Rx called to pharmacy

## 2013-07-03 NOTE — Telephone Encounter (Signed)
Avoid seroquel.  Can give valium 5 mg 30 min prior to ct.

## 2013-07-03 NOTE — Telephone Encounter (Signed)
Pt to have CT scan tomorrow afternoon.  Concerned about his agitation at imaging center.  Can she give him something to help calm him while he is there.  Can she give him one of his Seroquel to help calm him.  Still need one at night to help sleep as always. Just for this once.

## 2013-07-04 ENCOUNTER — Ambulatory Visit
Admission: RE | Admit: 2013-07-04 | Discharge: 2013-07-04 | Disposition: A | Discharge status: Home / Self Care | Payer: Medicare Other | Source: Ambulatory Visit | Attending: Family Medicine | Admitting: Family Medicine

## 2013-07-04 DIAGNOSIS — R7989 Other specified abnormal findings of blood chemistry: Secondary | ICD-10-CM

## 2013-07-04 DIAGNOSIS — R945 Abnormal results of liver function studies: Principal | ICD-10-CM

## 2013-07-04 MED ORDER — IOHEXOL 300 MG/ML  SOLN
100.0000 mL | Freq: Once | INTRAMUSCULAR | Status: AC | PRN
  Administered 2013-07-04: 100 mL via INTRAVENOUS

## 2013-07-05 ENCOUNTER — Encounter: Payer: Self-pay | Admitting: Family Medicine

## 2013-07-05 ENCOUNTER — Ambulatory Visit (INDEPENDENT_AMBULATORY_CARE_PROVIDER_SITE_OTHER): Payer: Medicare Other | Admitting: Family Medicine

## 2013-07-05 DIAGNOSIS — C787 Secondary malignant neoplasm of liver and intrahepatic bile duct: Secondary | ICD-10-CM

## 2013-07-05 DIAGNOSIS — C801 Malignant (primary) neoplasm, unspecified: Secondary | ICD-10-CM

## 2013-07-05 MED ORDER — OXYCODONE HCL 5 MG PO TABS: 5.0000 mg | ORAL_TABLET | ORAL | Status: AC | PRN

## 2013-07-05 NOTE — Progress Notes (Signed)
Subjective:    Patient ID: Adam Osborne, male    DOB: July 16, 1933, 80 y.o.   MRN: [id number]  HPI 06/22/13 A few days ago, the patient became choked while eating a meal. His sitter had to remove a piece of meat using her finger. There is some concern he may have aspirated. Over the last few days he developed a low-grade fever to 99. He had one episode of vomiting associated with clear mucus. He also is complaining of tenderness in his right upper quadrant and left upper quadrant.  His wife denies any coughing. There is no hemoptysis. There is no rhinorrhea or sore throat. There is no rash. The patient has not been constipated. His urinalysis today is benign.  At that time, my plan was: 1. Frequent urination Urinalysis today reveals no nitrites or leukocyte esterase. There is not they seem to be any signs of urinary tract - Urinalysis, Routine w reflex microscopic  2. Nausea with vomiting I am concerned about aspiration pneumonia versus possible ileus/small bowel obstruction. I will obtain a chest x-ray as well as a two-view of the abdomen.  IVC chest x-ray shows pneumonia on begin treatment with antibiotics. Angina rule out bowel obstruction/ileus/impaction/constipation with abdominal x-ray.  If the above studies are negative, the patient may simply be suffering from a viral gastroenteritis. - DG Chest 2 View; Future - DG Abd 2 Views; Future  The x-ray results were: Findings suspicious for right subpulmonic pleural effusion, moderate  in size. Right base atelectasis or infiltrate. Left nephrolithiasis.  No obstruction or free air.  Right basilar opacity, likely combination of effusion and  atelectasis/infiltrate as seen on chest x-ray. 06/29/13 The patient was subsequent started on Levaquin 750 mg by mouth daily for 10 days.  He has had no further cough. He has had no fever. However he continues to complain about epigastric abdominal pain and discomfort. 2 days ago, the patient had an  episode of vomiting. Shortly thereafter he slumped over in his chair and was unresponsive. This was not witnessed by the family but was relayed by the sitter. By the time the family arrived, the patient was responsive. There was no witnessed seizure activity. No witnesses are  available to explain what happened in the event.  It sounds as if the patient may have had an episode of vasovagal syncope after vomiting. EMS was called and his vital signs were normal. Furthermore the family states the patient was acting normally thereafter. They're here today for reevaluation. At present the patient is sitting comfortably in his chair with no problems.  At that time, my plan was: Clinically there is no evidence of pneumonia on his examination today, his breath sounds are normal in the right lower lobe. Concern that the antibiotic may be upsetting his stomach causing the vomiting. Therefore I've asked him to discontinue Levaquin. I like the family to watch him over the next few days. If the nausea and vomiting subsided no other symptoms develop, I believe the patient needs no further workup. It could have been an episode of vasovagal syncope related to vomiting. If patient has any more syncopal episodes, recommend going to the hospital. At the bare minimum I would recommend a 24-hour Holter monitor, echocardiogram of heart, and possibly an EEG to evaluate for seizures.  If the patient continues to experience epigastric abdominal discomfort and vomiting, I recommend a swallowing study to evaluate for stricture or aspiration, a CT scan of the abdomen and pelvis, and possibly a GI consultation for  an EGD depending on the severity of his symptoms.  The family is comfortable with this plan. We will monitor the patient clinically over the weekend. Any further syncopal episodes or nausea and vomiting will prompt a workup.  However the patient's liver function test returned significantly elevated.  Given his history of a  cholecystectomy this prompted a CT scan of abdomen and pelvis. These results are as follows: 1. Widespread hepatic metastasis. Likely from an incompletely imaged  right lower lobe primary bronchogenic carcinoma. This demonstrates  presumed direct mediastinal invasion with likely malignant right  pleural effusion. These results will be called to the ordering  clinician or representative by the Radiologist Assistant, and  communication documented in the PACS Dashboard.  2. Prostatomegaly. Cannot exclude direct bladder invasion. This  appearance is grossly similar since 2012.  3. Small bowel containing right inguinal hernia. Chronic but  slightly enlarged since the prior.  07/05/13 The patient's family is here today to discuss the results of the CT scan and to determine our next course of plan.  Past Medical History  Diagnosis Date  . Dementia   . Cancer     Gleason 6 prostate cancer  . Dementia    Current Outpatient Prescriptions on File Prior to Visit  Medication Sig Dispense Refill  . diazepam (VALIUM) 5 MG tablet Take 1 tablet (5 mg total) by mouth once.  1 tablet  0  . hydrocortisone (ANUSOL-HC) 25 MG suppository Place 1 suppository (25 mg total) rectally 2 (two) times daily.  20 suppository  0  . ibuprofen (ADVIL,MOTRIN) 200 MG tablet Take 400 mg by mouth every 6 (six) hours as needed for fever or moderate pain.      Marland Kitchen levofloxacin (LEVAQUIN) 750 MG tablet Take 1 tablet (750 mg total) by mouth daily.  10 tablet  0  . Memantine HCl ER (NAMENDA XR) 28 MG CP24 Take 28 mg by mouth daily.  30 capsule  5  . OVER THE COUNTER MEDICATION Take 2 capsules by mouth daily.      . QUEtiapine (SEROQUEL) 25 MG tablet Take 50 mg by mouth at bedtime.      . rivastigmine (EXELON) 4.6 mg/24hr Place 1 patch (4.6 mg total) onto the skin daily.  30 patch  12   No current facility-administered medications on file prior to visit.   Allergies  Allergen Reactions  . Aricept [Donepezil Hcl]      Dizziness Vivid dreams    History   Social History  . Marital Status: Married    Spouse Name: N/A    Number of Children: N/A  . Years of Education: N/A   Occupational History  . Not on file.   Social History Main Topics  . Smoking status: Former Smoker -- 1.00 packs/day for 20 years  . Smokeless tobacco: Never Used  . Alcohol Use: No  . Drug Use: Not on file  . Sexual Activity: Not on file   Other Topics Concern  . Not on file   Social History Narrative  . No narrative on file      Review of Systems  All other systems reviewed and are negative.       Objective:   Physical Exam   no physical exam was performed as the patient was not present        Assessment & Plan:  Metastasis to liver of unknown origin  Due to the patient's dementia, he did not come to today's appointment. 30 minutes was spent with the  family discussing the diagnosis, prognosis, and their desires regarding treatment.  I explained to the family that he likely has metastatic cancer in the liver from an unknown primary source. I explained that the most likely primary source is lung cancer from the right lung. The next step in treatment was desired would be to pursue a biopsy of one of the liver metastasis primary in the lung depending on the ease of biopsy.  I also offered hospice. I explained that metastatic cancer cannot be cured and that any treatment would be an effort to prolong life but not cure the cancer. I gave the family ample time to ask questions and recommended that he go home and discuss as a family what the patient's wishes would be. They will notify me of their decision and we will pursue biopsy or hospice. At the present time they're leaning towards hospice given his end-stage dementia. I did give him a prescription for oxycodone 5 mg every 6 hours as needed for pain and recommended that he start a stool softener if the patient begins to take pain medication.

## 2013-07-06 ENCOUNTER — Other Ambulatory Visit: Payer: Self-pay | Admitting: Family Medicine

## 2013-07-06 ENCOUNTER — Encounter: Payer: Self-pay | Admitting: Family Medicine

## 2013-07-06 DIAGNOSIS — C801 Malignant (primary) neoplasm, unspecified: Principal | ICD-10-CM

## 2013-07-06 DIAGNOSIS — R918 Other nonspecific abnormal finding of lung field: Secondary | ICD-10-CM

## 2013-07-06 DIAGNOSIS — C787 Secondary malignant neoplasm of liver and intrahepatic bile duct: Secondary | ICD-10-CM

## 2013-07-06 HISTORY — DX: Secondary malignant neoplasm of liver and intrahepatic bile duct: C78.7

## 2013-07-06 HISTORY — DX: Malignant (primary) neoplasm, unspecified: C80.1

## 2013-07-06 HISTORY — DX: Other nonspecific abnormal finding of lung field: R91.8

## 2013-07-10 ENCOUNTER — Telehealth: Payer: Self-pay | Admitting: Family Medicine

## 2013-07-10 DIAGNOSIS — R319 Hematuria, unspecified: Secondary | ICD-10-CM

## 2013-07-10 NOTE — Telephone Encounter (Signed)
Urinating more frequently, especially at night.  Some blood in urine.  ?? UTI  Does not want to treat without checking.  She is going to get a specimen and bring to office either today or first thing in the morning?  UA lab order placed.  Asked about Hospice, I assured her referral has been initiated and she should hear from them shortly.

## 2013-07-10 NOTE — Telephone Encounter (Signed)
ok 

## 2013-07-11 ENCOUNTER — Other Ambulatory Visit: Payer: Self-pay | Admitting: Family Medicine

## 2013-07-16 ENCOUNTER — Emergency Department (HOSPITAL_COMMUNITY)

## 2013-07-16 ENCOUNTER — Emergency Department (HOSPITAL_COMMUNITY)
Admission: EM | Admit: 2013-07-16 | Discharge: 2013-07-16 | Disposition: A | Discharge status: Home / Self Care | Attending: Emergency Medicine | Admitting: Emergency Medicine

## 2013-07-16 ENCOUNTER — Encounter (HOSPITAL_COMMUNITY): Payer: Self-pay | Admitting: Emergency Medicine

## 2013-07-16 DIAGNOSIS — R296 Repeated falls: Secondary | ICD-10-CM | POA: Insufficient documentation

## 2013-07-16 DIAGNOSIS — C787 Secondary malignant neoplasm of liver and intrahepatic bile duct: Secondary | ICD-10-CM | POA: Insufficient documentation

## 2013-07-16 DIAGNOSIS — IMO0002 Reserved for concepts with insufficient information to code with codable children: Secondary | ICD-10-CM | POA: Insufficient documentation

## 2013-07-16 DIAGNOSIS — C3490 Malignant neoplasm of unspecified part of unspecified bronchus or lung: Secondary | ICD-10-CM | POA: Insufficient documentation

## 2013-07-16 DIAGNOSIS — C801 Malignant (primary) neoplasm, unspecified: Secondary | ICD-10-CM | POA: Insufficient documentation

## 2013-07-16 DIAGNOSIS — R222 Localized swelling, mass and lump, trunk: Secondary | ICD-10-CM | POA: Insufficient documentation

## 2013-07-16 DIAGNOSIS — Y929 Unspecified place or not applicable: Secondary | ICD-10-CM | POA: Insufficient documentation

## 2013-07-16 DIAGNOSIS — R918 Other nonspecific abnormal finding of lung field: Secondary | ICD-10-CM

## 2013-07-16 DIAGNOSIS — F29 Unspecified psychosis not due to a substance or known physiological condition: Secondary | ICD-10-CM | POA: Insufficient documentation

## 2013-07-16 DIAGNOSIS — G309 Alzheimer's disease, unspecified: Secondary | ICD-10-CM | POA: Insufficient documentation

## 2013-07-16 DIAGNOSIS — Z87891 Personal history of nicotine dependence: Secondary | ICD-10-CM | POA: Insufficient documentation

## 2013-07-16 DIAGNOSIS — F0280 Dementia in other diseases classified elsewhere without behavioral disturbance: Secondary | ICD-10-CM | POA: Insufficient documentation

## 2013-07-16 DIAGNOSIS — Z8546 Personal history of malignant neoplasm of prostate: Secondary | ICD-10-CM | POA: Insufficient documentation

## 2013-07-16 DIAGNOSIS — Z79899 Other long term (current) drug therapy: Secondary | ICD-10-CM | POA: Insufficient documentation

## 2013-07-16 DIAGNOSIS — Y939 Activity, unspecified: Secondary | ICD-10-CM | POA: Insufficient documentation

## 2013-07-16 DIAGNOSIS — R4182 Altered mental status, unspecified: Secondary | ICD-10-CM | POA: Insufficient documentation

## 2013-07-16 LAB — RAPID URINE DRUG SCREEN, HOSP PERFORMED
Amphetamines: NOT DETECTED
Barbiturates: NOT DETECTED
Benzodiazepines: NOT DETECTED
Cocaine: NOT DETECTED
OPIATES: NOT DETECTED
TETRAHYDROCANNABINOL: NOT DETECTED

## 2013-07-16 LAB — COMPREHENSIVE METABOLIC PANEL
ALT: 105 U/L — ABNORMAL HIGH (ref 0–53)
AST: 251 U/L — ABNORMAL HIGH (ref 0–37)
Albumin: 2.9 g/dL — ABNORMAL LOW (ref 3.5–5.2)
Alkaline Phosphatase: 348 U/L — ABNORMAL HIGH (ref 39–117)
BUN: 26 mg/dL — ABNORMAL HIGH (ref 6–23)
CO2: 23 meq/L (ref 19–32)
CREATININE: 1.16 mg/dL (ref 0.50–1.35)
Calcium: 9.4 mg/dL (ref 8.4–10.5)
Chloride: 104 mEq/L (ref 96–112)
GFR calc Af Amer: 68 mL/min — ABNORMAL LOW (ref >90)
GFR, EST NON AFRICAN AMERICAN: 59 mL/min — AB (ref >90)
Glucose, Bld: 114 mg/dL — ABNORMAL HIGH (ref 70–99)
Potassium: 4.5 mEq/L (ref 3.7–5.3)
Sodium: 142 mEq/L (ref 137–147)
Total Bilirubin: 2.5 mg/dL — ABNORMAL HIGH (ref 0.3–1.2)
Total Protein: 6.5 g/dL (ref 6.0–8.3)

## 2013-07-16 LAB — AMMONIA: AMMONIA: 43 umol/L (ref 11–60)

## 2013-07-16 LAB — URINALYSIS, ROUTINE W REFLEX MICROSCOPIC
Glucose, UA: NEGATIVE mg/dL
Ketones, ur: 15 mg/dL — AB
NITRITE: NEGATIVE
PROTEIN: NEGATIVE mg/dL
Specific Gravity, Urine: 1.026 (ref 1.005–1.030)
UROBILINOGEN UA: 1 mg/dL (ref 0.0–1.0)
pH: 5.5 (ref 5.0–8.0)

## 2013-07-16 LAB — CBC WITH DIFFERENTIAL/PLATELET
Basophils Absolute: 0 10*3/uL (ref 0.0–0.1)
Basophils Relative: 1 % (ref 0–1)
EOS ABS: 0.1 10*3/uL (ref 0.0–0.7)
Eosinophils Relative: 1 % (ref 0–5)
HEMATOCRIT: 35.7 % — AB (ref 39.0–52.0)
Hemoglobin: 11.9 g/dL — ABNORMAL LOW (ref 13.0–17.0)
LYMPHS ABS: 1.5 10*3/uL (ref 0.7–4.0)
LYMPHS PCT: 20 % (ref 12–46)
MCH: 29.7 pg (ref 26.0–34.0)
MCHC: 33.3 g/dL (ref 30.0–36.0)
MCV: 89 fL (ref 78.0–100.0)
MONO ABS: 0.7 10*3/uL (ref 0.1–1.0)
Monocytes Relative: 10 % (ref 3–12)
Neutro Abs: 5.3 10*3/uL (ref 1.7–7.7)
Neutrophils Relative %: 70 % (ref 43–77)
Platelets: 138 10*3/uL — ABNORMAL LOW (ref 150–400)
RBC: 4.01 MIL/uL — AB (ref 4.22–5.81)
RDW: 16.2 % — AB (ref 11.5–15.5)
WBC: 7.6 10*3/uL (ref 4.0–10.5)

## 2013-07-16 LAB — POCT I-STAT TROPONIN I: Troponin i, poc: 0.01 ng/mL (ref 0.00–0.08)

## 2013-07-16 LAB — URINE MICROSCOPIC-ADD ON

## 2013-07-16 NOTE — ED Provider Notes (Signed)
I have personally seen and examined the patient.  I have discussed the plan of care with the resident.  I have reviewed the documentation on PMH/FH/Soc. History.  I have reviewed the documentation of the resident and agree.  Pt stable in the ED.  No acute injury noted by imaging Family feels comfortable taking patient home as he has in home care as well as hospice Stable for d/c home  Sharyon Cable, MD 07/16/13 1647

## 2013-07-16 NOTE — ED Provider Notes (Signed)
CSN: [id number]     Arrival date & time 07/16/13  1255 History   First MD Initiated Contact with Patient 07/16/13 1309     Chief Complaint  Patient presents with  . Altered Mental Status     HPI  80 yo hospice patient who was recently diagnosed with CA ( lung and liver) since then patient has been on regimen of pain medications and ativan. Over the last 24 hours patient fell 3 times onto his knees with no LOC. Patient is alert only to self. He is a DNR. Patient himself complains of no pain. Denies HA, neck pain, extremity pain, CP, SOB, ABD pain. Hx of alzheimers.    Past Medical History  Diagnosis Date  . Dementia   . Cancer     Gleason 6 prostate cancer  . Dementia   . Metastasis to liver of unknown origin 07/06/2013  . Right lower lobe lung mass 07/06/2013   Past Surgical History  Procedure Laterality Date  . Cholecystectomy     Family History  Problem Relation Age of Onset  . Cancer Mother     lung  . Dementia Sister    History  Substance Use Topics  . Smoking status: Former Smoker -- 1.00 packs/day for 20 years  . Smokeless tobacco: Never Used  . Alcohol Use: No    Review of Systems  Constitutional: Negative for fever, activity change and appetite change.  HENT: Negative for congestion, ear pain, rhinorrhea, sinus pressure and sore throat.   Eyes: Negative for pain and redness.  Respiratory: Negative for cough, chest tightness and shortness of breath.   Cardiovascular: Negative for chest pain and palpitations.  Gastrointestinal: Negative for nausea, vomiting, abdominal pain, diarrhea and abdominal distention.  Genitourinary: Negative for dysuria, flank pain and difficulty urinating.  Musculoskeletal: Negative for back pain, neck pain and neck stiffness.  Skin: Negative for rash and wound.  Neurological: Negative for dizziness, light-headedness, numbness and headaches.  Hematological: Negative for adenopathy.  Psychiatric/Behavioral: Positive for confusion. Negative  for behavioral problems and agitation.      Allergies  Aricept  Home Medications   Current Outpatient Rx  Name  Route  Sig  Dispense  Refill  . diazepam (VALIUM) 5 MG tablet   Oral   Take 1 tablet (5 mg total) by mouth once.   1 tablet   0     Take 30 min prior to procedure   . hydrocortisone (ANUSOL-HC) 25 MG suppository   Rectal   Place 1 suppository (25 mg total) rectally 2 (two) times daily.   20 suppository   0   . ibuprofen (ADVIL,MOTRIN) 200 MG tablet   Oral   Take 400 mg by mouth every 6 (six) hours as needed for fever or moderate pain.         Marland Kitchen LORazepam (ATIVAN) 0.5 MG tablet   Oral   Take 0.5 mg by mouth every 4 (four) hours as needed (restlessness, agitation).         Marland Kitchen oxyCODONE (ROXICODONE) 5 MG immediate release tablet   Oral   Take 1 tablet (5 mg total) by mouth every 4 (four) hours as needed for severe pain.   30 tablet   0   . prochlorperazine (COMPAZINE) 10 MG tablet   Oral   Take 10 mg by mouth every 6 (six) hours as needed for nausea or vomiting.         Marland Kitchen QUEtiapine (SEROQUEL) 25 MG tablet   Oral   Take  100 mg by mouth at bedtime.         . rivastigmine (EXELON) 4.6 mg/24hr   Transdermal   Place 1 patch (4.6 mg total) onto the skin daily.   30 patch   12    BP 127/75  Temp(Src) 98.2 F (36.8 C) (Oral)  Resp 22  SpO2 96% Physical Exam  Constitutional: He is oriented to person, place, and time. He appears well-developed and well-nourished. No distress.  HENT:  Head: Normocephalic and atraumatic.  Nose: Nose normal.  Mouth/Throat: Oropharynx is clear and moist.  Eyes: Conjunctivae and EOM are normal. Pupils are equal, round, and reactive to light.  Neck: Normal range of motion. Neck supple. No tracheal deviation present.  Cardiovascular: Normal rate, regular rhythm, normal heart sounds and intact distal pulses.   Pulmonary/Chest: Effort normal and breath sounds normal. No respiratory distress. He has no rales.   Abdominal: Soft. Bowel sounds are normal. He exhibits no distension. There is no tenderness. There is no rebound and no guarding.  Musculoskeletal: Normal range of motion. He exhibits no edema and no tenderness.  Superficial abrasion of knees blt   Neurological: He is alert and oriented to person, place, and time.  Skin: Skin is warm and dry.  Psychiatric: He has a normal mood and affect. His behavior is normal.    ED Course  Procedures (including critical care time) Labs Review Labs Reviewed  CBC WITH DIFFERENTIAL  COMPREHENSIVE METABOLIC PANEL  AMMONIA  URINALYSIS, ROUTINE W REFLEX MICROSCOPIC  URINE RAPID DRUG SCREEN (HOSP PERFORMED)   Results for orders placed during the hospital encounter of 07/16/13  CBC WITH DIFFERENTIAL      Result Value Ref Range   WBC 7.6  4.0 - 10.5 K/uL   RBC 4.01 (*) 4.22 - 5.81 MIL/uL   Hemoglobin 11.9 (*) 13.0 - 17.0 g/dL   HCT 35.7 (*) 39.0 - 52.0 %   MCV 89.0  78.0 - 100.0 fL   MCH 29.7  26.0 - 34.0 pg   MCHC 33.3  30.0 - 36.0 g/dL   RDW 16.2 (*) 11.5 - 15.5 %   Platelets 138 (*) 150 - 400 K/uL   Neutrophils Relative % 70  43 - 77 %   Neutro Abs 5.3  1.7 - 7.7 K/uL   Lymphocytes Relative 20  12 - 46 %   Lymphs Abs 1.5  0.7 - 4.0 K/uL   Monocytes Relative 10  3 - 12 %   Monocytes Absolute 0.7  0.1 - 1.0 K/uL   Eosinophils Relative 1  0 - 5 %   Eosinophils Absolute 0.1  0.0 - 0.7 K/uL   Basophils Relative 1  0 - 1 %   Basophils Absolute 0.0  0.0 - 0.1 K/uL  COMPREHENSIVE METABOLIC PANEL      Result Value Ref Range   Sodium 142  137 - 147 mEq/L   Potassium 4.5  3.7 - 5.3 mEq/L   Chloride 104  96 - 112 mEq/L   CO2 23  19 - 32 mEq/L   Glucose, Bld 114 (*) 70 - 99 mg/dL   BUN 26 (*) 6 - 23 mg/dL   Creatinine, Ser 1.16  0.50 - 1.35 mg/dL   Calcium 9.4  8.4 - 10.5 mg/dL   Total Protein 6.5  6.0 - 8.3 g/dL   Albumin 2.9 (*) 3.5 - 5.2 g/dL   AST 251 (*) 0 - 37 U/L   ALT 105 (*) 0 - 53 U/L   Alkaline Phosphatase  348 (*) 39 - 117 U/L    Total Bilirubin 2.5 (*) 0.3 - 1.2 mg/dL   GFR calc non Af Amer 59 (*) >90 mL/min   GFR calc Af Amer 68 (*) >90 mL/min  AMMONIA      Result Value Ref Range   Ammonia 43  11 - 60 umol/L  URINALYSIS, ROUTINE W REFLEX MICROSCOPIC      Result Value Ref Range   Color, Urine AMBER (*) YELLOW   APPearance HAZY (*) CLEAR   Specific Gravity, Urine 1.026  1.005 - 1.030   pH 5.5  5.0 - 8.0   Glucose, UA NEGATIVE  NEGATIVE mg/dL   Hgb urine dipstick MODERATE (*) NEGATIVE   Bilirubin Urine SMALL (*) NEGATIVE   Ketones, ur 15 (*) NEGATIVE mg/dL   Protein, ur NEGATIVE  NEGATIVE mg/dL   Urobilinogen, UA 1.0  0.0 - 1.0 mg/dL   Nitrite NEGATIVE  NEGATIVE   Leukocytes, UA TRACE (*) NEGATIVE  URINE RAPID DRUG SCREEN (HOSP PERFORMED)      Result Value Ref Range   Opiates NONE DETECTED  NONE DETECTED   Cocaine NONE DETECTED  NONE DETECTED   Benzodiazepines NONE DETECTED  NONE DETECTED   Amphetamines NONE DETECTED  NONE DETECTED   Tetrahydrocannabinol NONE DETECTED  NONE DETECTED   Barbiturates NONE DETECTED  NONE DETECTED  URINE MICROSCOPIC-ADD ON      Result Value Ref Range   Squamous Epithelial / LPF RARE  RARE   WBC, UA 0-2  <3 WBC/hpf   RBC / HPF 7-10  <3 RBC/hpf   Bacteria, UA FEW (*) RARE   Urine-Other MUCOUS PRESENT    POCT I-STAT TROPONIN I      Result Value Ref Range   Troponin i, poc 0.01  0.00 - 0.08 ng/mL   Comment 3             Imaging Review Dg Chest 2 View  07/16/2013   CLINICAL DATA:  Recent traumatic injury  EXAM: CHEST  2 VIEW  COMPARISON:  07/04/2013  FINDINGS: A right-sided pleural effusion is again identified similar to that seen on the prior CT examination. The known subcarinal mass lesion is not well appreciated on this exam. The left lung is clear. The cardiac shadow is stable.  IMPRESSION: Stable right-sided pleural effusions secondary to known underlying mass lesion. No acute abnormality is noted.   Electronically Signed   By: Inez Catalina M.D.   On: 07/16/2013 15:18    Dg Pelvis 1-2 Views  07/16/2013   CLINICAL DATA:  The patient fell today.  Altered mental status.  EXAM: PELVIS - 1-2 VIEW  COMPARISON:  Radiographs dated 06/22/2013  FINDINGS: There is no evidence of pelvic fracture or diastasis. No other pelvic bone lesions are seen.  IMPRESSION: Normal exam.   Electronically Signed   By: Rozetta Nunnery M.D.   On: 07/16/2013 15:20   Ct Head Wo Contrast  07/16/2013   CLINICAL DATA:  Altered mental status.  EXAM: CT HEAD WITHOUT CONTRAST  CT CERVICAL SPINE WITHOUT CONTRAST  TECHNIQUE: Multidetector CT imaging of the head and cervical spine was performed following the standard protocol without intravenous contrast. Multiplanar CT image reconstructions of the cervical spine were also generated.  COMPARISON:  05/15/2013 head CT  FINDINGS: CT HEAD FINDINGS  Skull and Sinuses:No significant abnormality.  Orbits: No acute abnormality.  Brain: No evidence of acute abnormality, such as acute infarction, hemorrhage, hydrocephalus, or mass lesion/mass effect. Brain atrophy with ex vacuo enlargement of  the lateral ventricles, pattern stable from previous (predominantly temporal and parietal). Focal sulcal widening in the left frontal vertex, chronic and likely incidental. Chronic small vessel ischemic white matter disease.  CT CERVICAL SPINE FINDINGS  No evidence of acute fracture or traumatic subluxation. There is mild head rotation which accounts for asymmetry of the C1/2 facets. Fragmentation of the bilateral occipital condyles appears chronic/ degenerative. There is diffuse spondylosis with posterior osteophytic ridging narrowing the ventral canal. No focally advanced osseous canal or foraminal stenosis. No prevertebral edema or gross cervical canal hematoma. There is a layering right pleural effusion, known per previous radiography.  IMPRESSION: 1. No evidence of acute intracranial or cervical spine injury. 2. Cerebral atrophy with a temporal/parietal predominance.   Electronically  Signed   By: Jorje Guild M.D.   On: 07/16/2013 16:05   Ct Cervical Spine Wo Contrast  07/16/2013   CLINICAL DATA:  Altered mental status.  EXAM: CT HEAD WITHOUT CONTRAST  CT CERVICAL SPINE WITHOUT CONTRAST  TECHNIQUE: Multidetector CT imaging of the head and cervical spine was performed following the standard protocol without intravenous contrast. Multiplanar CT image reconstructions of the cervical spine were also generated.  COMPARISON:  05/15/2013 head CT  FINDINGS: CT HEAD FINDINGS  Skull and Sinuses:No significant abnormality.  Orbits: No acute abnormality.  Brain: No evidence of acute abnormality, such as acute infarction, hemorrhage, hydrocephalus, or mass lesion/mass effect. Brain atrophy with ex vacuo enlargement of the lateral ventricles, pattern stable from previous (predominantly temporal and parietal). Focal sulcal widening in the left frontal vertex, chronic and likely incidental. Chronic small vessel ischemic white matter disease.  CT CERVICAL SPINE FINDINGS  No evidence of acute fracture or traumatic subluxation. There is mild head rotation which accounts for asymmetry of the C1/2 facets. Fragmentation of the bilateral occipital condyles appears chronic/ degenerative. There is diffuse spondylosis with posterior osteophytic ridging narrowing the ventral canal. No focally advanced osseous canal or foraminal stenosis. No prevertebral edema or gross cervical canal hematoma. There is a layering right pleural effusion, known per previous radiography.  IMPRESSION: 1. No evidence of acute intracranial or cervical spine injury. 2. Cerebral atrophy with a temporal/parietal predominance.   Electronically Signed   By: Jorje Guild M.D.   On: 07/16/2013 16:05   Dg Knee Complete 4 Views Left  07/16/2013   CLINICAL DATA:  The patient fell.  EXAM: LEFT KNEE - COMPLETE 4+ VIEW  COMPARISON:  None.  FINDINGS: There is no fracture or dislocation or joint effusion. Small osteophytes in the upper pole of the  patella and at the anterior tibial tubercle.  IMPRESSION: No significant abnormality.   Electronically Signed   By: Rozetta Nunnery M.D.   On: 07/16/2013 15:18   Dg Knee Complete 4 Views Right  07/16/2013   CLINICAL DATA:  Patient fell.  Trauma to the knees.  EXAM: RIGHT KNEE - COMPLETE 4+ VIEW  COMPARISON:  None.  FINDINGS: There is no fracture or dislocation or joint effusion. Prominent osteophytes at the insertion of the patellar tendon on the anterior tibial tubercle and of the quadriceps tendon on the upper pole of the patella.  IMPRESSION: No acute abnormalities.   Electronically Signed   By: Rozetta Nunnery M.D.   On: 07/16/2013 15:19    EKG Interpretation    Date/Time:  Monday July 16 2013 12:58:27 EST Ventricular Rate:  109 PR Interval:  143 QRS Duration: 83 QT Interval:  319 QTC Calculation: 429 R Axis:   -167 Text Interpretation:  Sinus  tachycardia Right axis deviation Low voltage, precordial leads Consider anterior infarct Baseline wander in lead(s) V2 Confirmed by [place] [phone]) on 07/16/2013 3:51:00 PM            MDM   Final diagnoses:  Altered mental status  Metastasis to liver of unknown origin  Right lower lobe lung mass    80 yo M in NAD AFVSS non toxic appearing. Patient with new diagnosis of liver and lung CA. Now in hospice care. On oxycodone, ativan and Seroquel. Per family since starting these medications pt seems more confused. Presented today for evaluation of a fall. Imaging including CT head , c spine , XR of knees and hip with no acute findings. Lab work unrevealing. Results at baseline for patient. Thorough conversation with the family on plan, findings and return precautions. Offered to keep patient inpatient for further testing but family felt patient would prefer to be home. He is DNR and hospice. Agree with family as further testing would not change ultimate management of his condition. Patient was able to stand up and ambulate in ED.      Ruthell Rummage, MD 07/16/13 [phone]

## 2013-07-16 NOTE — ED Notes (Signed)
Per EMS, pt's family reporting pt increasingly confused since around 7pm last night. Pt has hx of alzheimers and liver CA. Pt started on new medication- Seroquel recently. Pt reportedly fell 4 times between 7pm yesterday and today. EMS reports not bruising and no pain noted. Pt is alert to self. CBG 139, temp 97.7, 132/86, HR90. Pt is a hospice patient and has a DNR form.

## 2013-07-16 NOTE — Discharge Instructions (Signed)
Altered Mental Status Altered mental status most often refers to an abnormal change in your responsiveness and awareness. It can affect your speech, thought, mobility, memory, attention span, or alertness. It can range from slight confusion to complete unresponsiveness (coma). Altered mental status can be a sign of a serious underlying medical condition. Rapid evaluation and medical treatment is necessary for patients having an altered mental status. CAUSES   Low blood sugar (hypoglycemia) or diabetes.  Severe loss of body fluids (dehydration) or a body salt (electrolyte) imbalance.  A stroke or other neurologic problem, such as dementia or delirium.  A head injury or tumor.  A drug or alcohol overdose.  Exposure to toxins or poisons.  Depression, anxiety, and stress.  A low oxygen level (hypoxia).  An infection.  Blood loss.  Twitching or shaking (seizure).  Heart problems, such as heart attack or heart rhythm problems (arrhythmias).  A body temperature that is too low or too high (hypothermia or hyperthermia). DIAGNOSIS  A diagnosis is based on your history, symptoms, physical and neurologic examinations, and diagnostic tests. Diagnostic tests may include:  Measurement of your blood pressure, pulse, breathing, and oxygen levels (vital signs).  Blood tests.  Urine tests.  X-ray exams.  A computerized magnetic scan (magnetic resonance imaging, MRI).  A computerized X-ray scan (computed tomography, CT scan). TREATMENT  Treatment will depend on the cause. Treatment may include:  Management of an underlying medical or mental health condition.  Critical care or support in the hospital. [place]   Only take over-the-counter or prescription medicines for pain, discomfort, or fever as directed by your caregiver.  Manage underlying conditions as directed by your caregiver.  Eat a healthy, well-balanced diet to maintain strength.  Join a support group or  prevention program to cope with the condition or trauma that caused the altered mental status. Ask your caregiver to help choose a program that works for you.  Follow up with your caregiver for further examination, therapy, or testing as directed. SEEK MEDICAL CARE IF:   You feel unwell or have chills.  You or your family notice a change in your behavior or your alertness.  You have trouble following your caregiver's treatment plan.  You have questions or concerns. SEEK IMMEDIATE MEDICAL CARE IF:   You have a rapid heartbeat or have chest pain.  You have difficulty breathing.  You have a fever.  You have a headache with a stiff neck.  You cough up blood.  You have blood in your urine or stool.  You have severe agitation or confusion. MAKE SURE YOU:   Understand these instructions.  Will watch your condition.  Will get help right away if you are not doing well or get worse. Document Released: 11/04/2009 Document Revised: 08/09/2011 Document Reviewed: 11/04/2009 [employer] Patient Information 2014 [place].

## 2013-07-18 ENCOUNTER — Telehealth: Payer: Self-pay | Admitting: Family Medicine

## 2013-07-18 NOTE — Telephone Encounter (Signed)
Hospice nurse called and stated that wife is afraid to give pt seroquel due to the side effects of the medication but would like to try Valium to give to him for agitation at night???

## 2013-07-19 ENCOUNTER — Encounter: Payer: Self-pay | Admitting: Family Medicine

## 2013-07-19 MED ORDER — DIAZEPAM 5 MG PO TABS: ORAL_TABLET | ORAL | Status: AC

## 2013-07-19 NOTE — Telephone Encounter (Signed)
Hospice is suppose to call in Valium but they are suppose to call and speak to Dr Dennard Schaumann Call back number is [phone]

## 2013-07-19 NOTE — Telephone Encounter (Signed)
This encounter was created in error - please disregard.

## 2013-07-19 NOTE — Telephone Encounter (Signed)
Med called into pharm. Hospice nurse aware.

## 2013-07-19 NOTE — Telephone Encounter (Signed)
Valium 5 mg poqhs prn agitation is okay. (30)

## 2013-07-24 ENCOUNTER — Telehealth: Payer: Self-pay | Admitting: *Deleted

## 2013-07-24 NOTE — Telephone Encounter (Signed)
Received call from [place], Arbie Cookey. Reported that pt has expired at home peacefully at 1522.

## 2013-07-25 NOTE — Telephone Encounter (Signed)
Dr. Dennard Schaumann aware

## 2013-07-29 DEATH — deceased

## 2015-01-11 IMAGING — CT CT CERVICAL SPINE W/O CM
4 of 5 series · 14 of 33 positions shown, 16 images · non-contrast
Comparison: 05/15/2013 head CT

CLINICAL DATA: Altered mental status.

EXAM:
CT HEAD WITHOUT CONTRAST
CT CERVICAL SPINE WITHOUT CONTRAST
TECHNIQUE: Multidetector CT imaging of the head and cervical spine was
performed following the standard protocol without intravenous
contrast. Multiplanar CT image reconstructions of the cervical spine
were also generated.

[Series 5: c_spine 2.0 i30s 3 · axial · 0.29mm/px · z∈[-321,-199]mm · 4 of 103 slices shown, 5 images]
[im 21/103  soft-tissue]
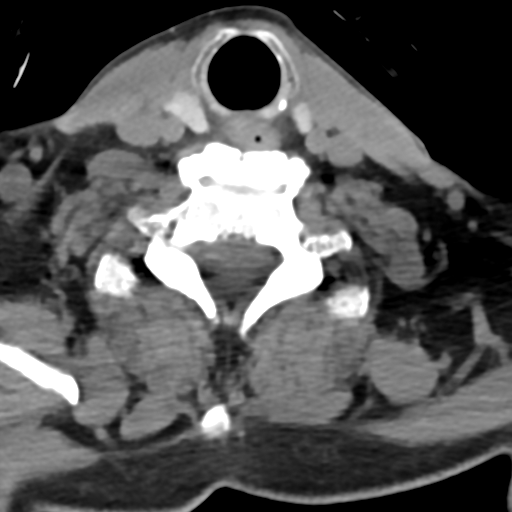
[im 21/103  bone]
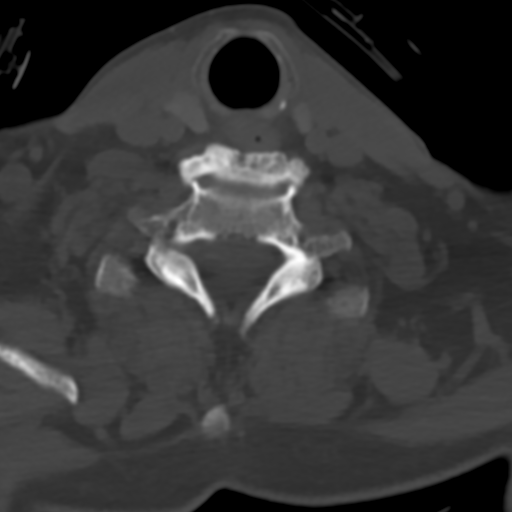
[im 41/103  bone]
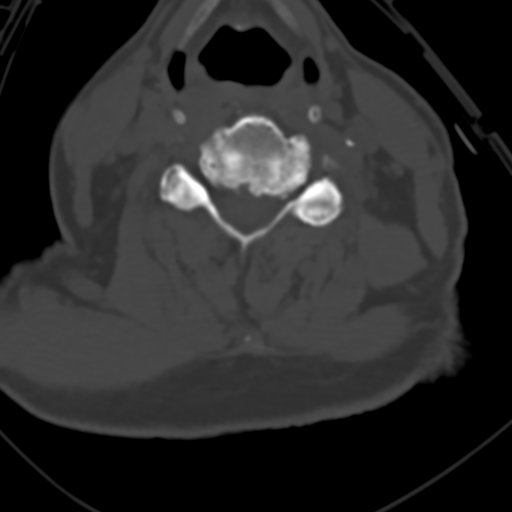
[im 62/103  bone]
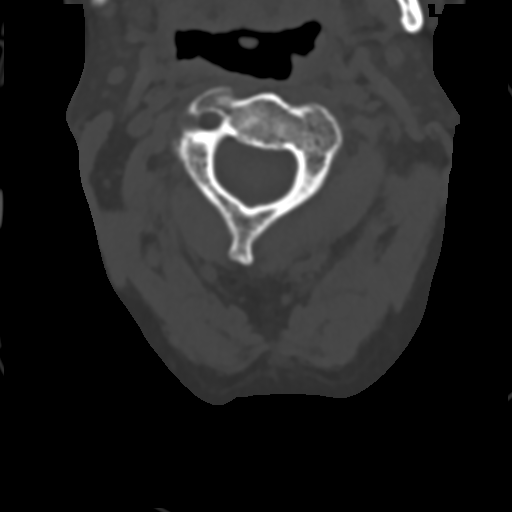
[im 82/103  bone]
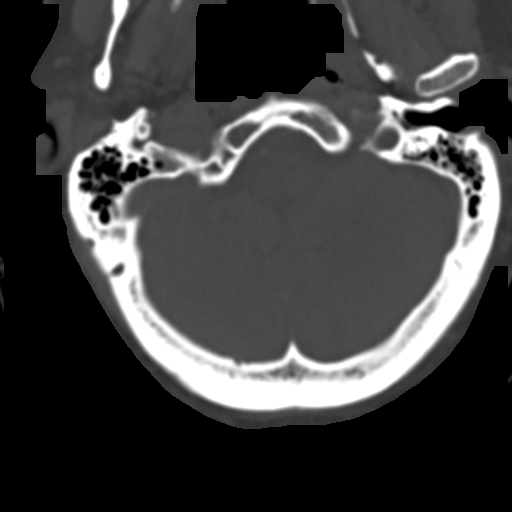

[Series 7: coronals · coronal · 0.20mm/px · 3 of 50 slices shown]
[im 10/50  bone]
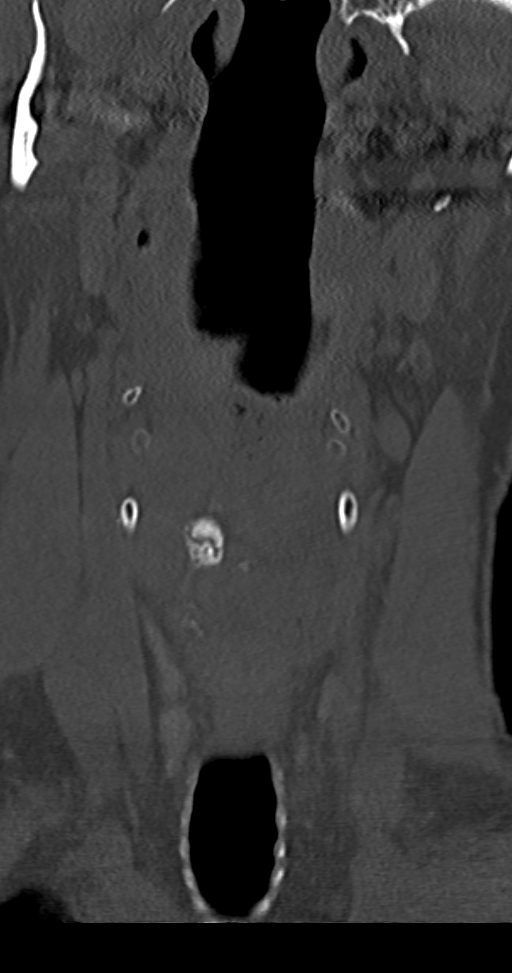
[im 20/50  bone]
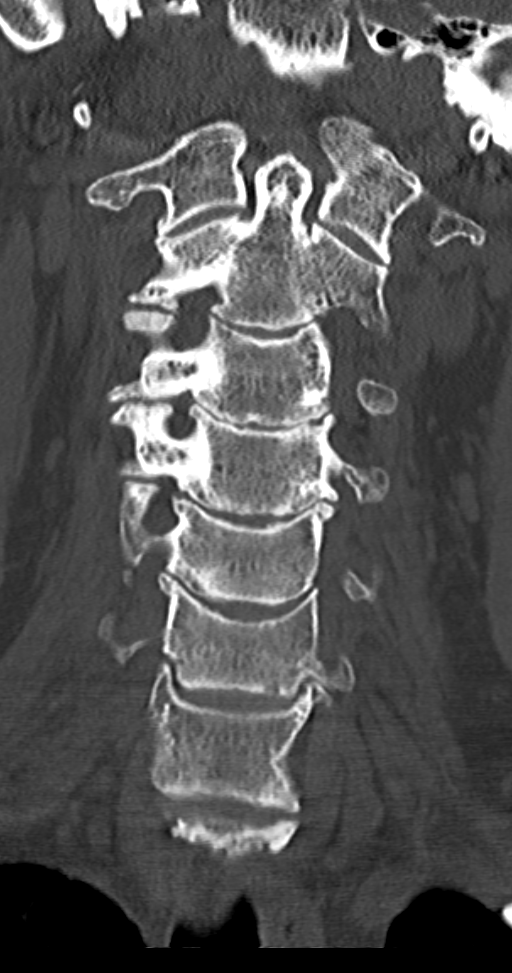
[im 30/50  bone]
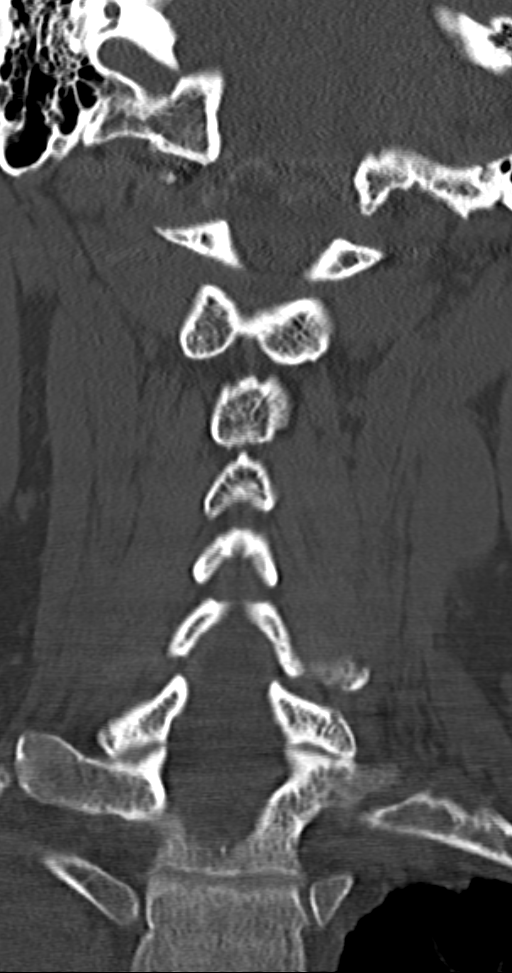

[Series 8: orthogonals · axial · 0.21mm/px · z∈[-332,-295]mm · 2 of 97 slices shown]
[im 20/97  bone]
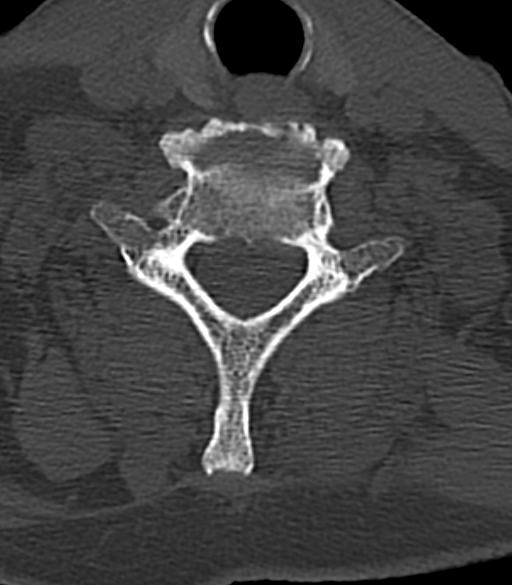
[im 39/97  bone]
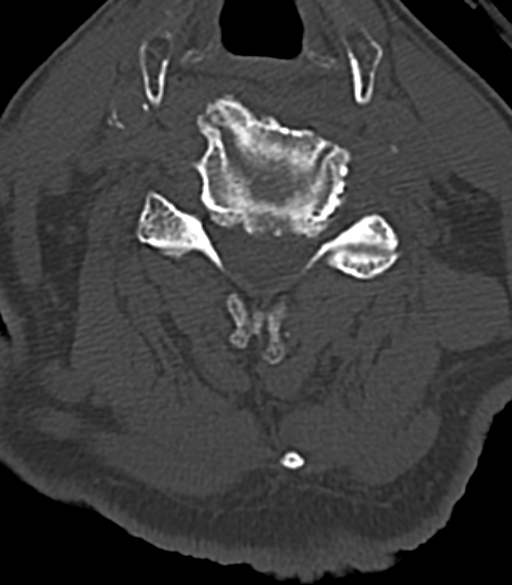

[Series 602: <mpr thick range> · sagittal · 0.40mm/px · 5 of 35 slices shown, 6 images]
[im 12/35  bone]
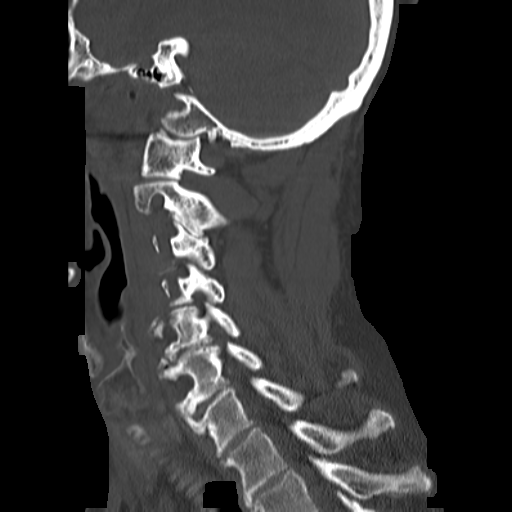
[im 15/35  bone]
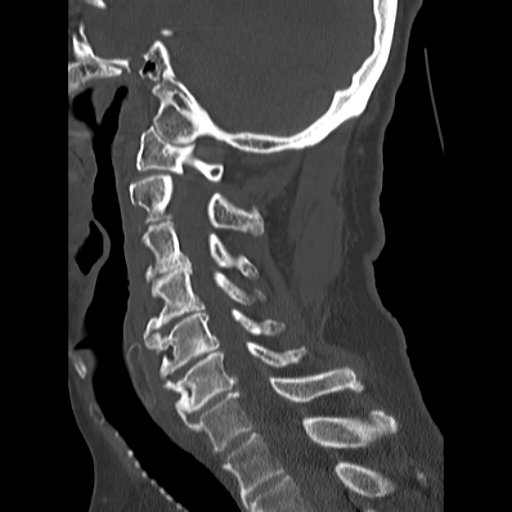
[im 18/35  soft-tissue]
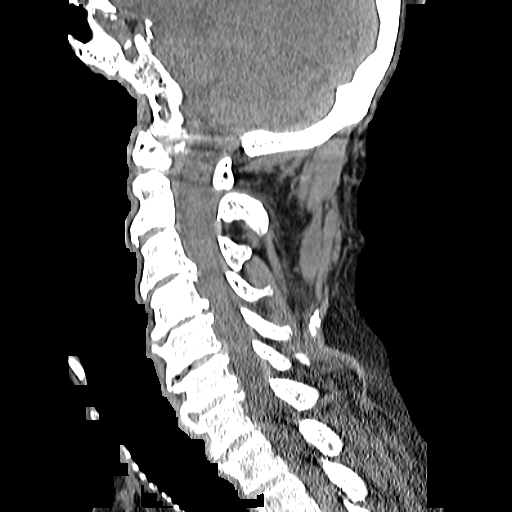
[im 18/35  bone]
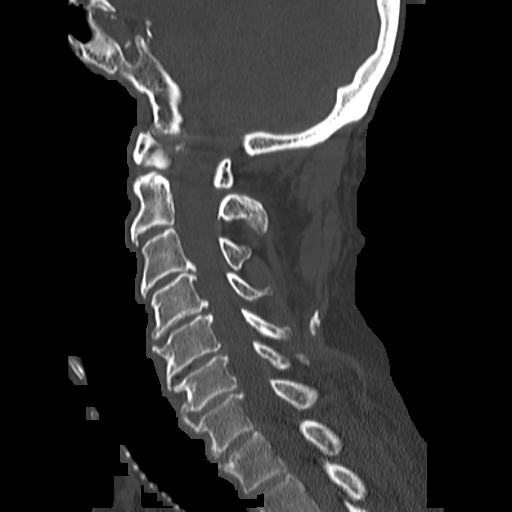
[im 20/35  bone]
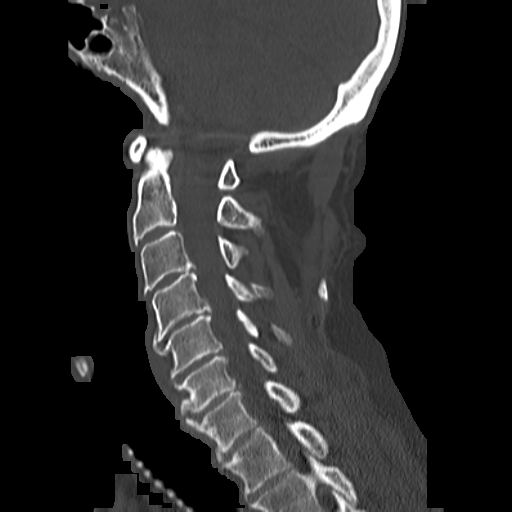
[im 23/35  bone]
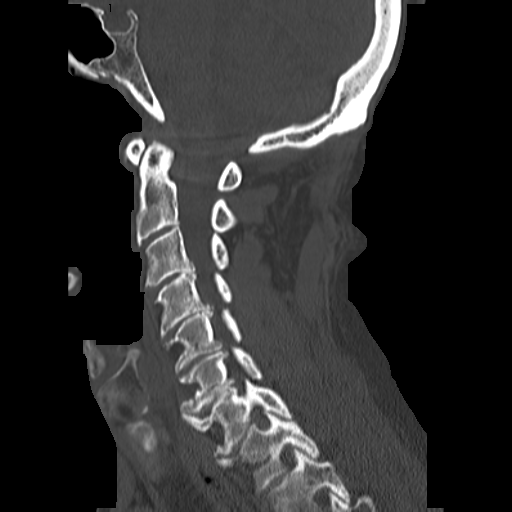

[14 of 33 positions shown; findings below may reference images not displayed]

FINDINGS: CT HEAD FINDINGS

Skull and Sinuses:No significant abnormality.

Orbits: No acute abnormality.

Brain: No evidence of acute abnormality, such as acute infarction,
hemorrhage, hydrocephalus, or mass lesion/mass effect. Brain atrophy
with ex vacuo enlargement of the lateral ventricles, pattern stable
from previous (predominantly temporal and parietal). Focal sulcal
widening in the left frontal vertex, chronic and likely incidental.
Chronic small vessel ischemic white matter disease.

CT CERVICAL SPINE FINDINGS

No evidence of acute fracture or traumatic subluxation. There is
mild head rotation which accounts for asymmetry of the C1/2 facets.
Fragmentation of the bilateral occipital condyles appears chronic/
degenerative. There is diffuse spondylosis with posterior
osteophytic ridging narrowing the ventral canal. No focally advanced
osseous canal or foraminal stenosis. No prevertebral edema or gross
cervical canal hematoma. There is a layering right pleural effusion,
known per previous radiography.
IMPRESSION: 1. No evidence of acute intracranial or cervical spine injury.
2. Cerebral atrophy with a temporal/parietal predominance.

## 2015-01-11 IMAGING — CR DG KNEE COMPLETE 4+V*L*
1 series · 1 of 1 positions shown · non-contrast
Comparison: None.

CLINICAL DATA: The patient fell.

EXAM:
LEFT KNEE - COMPLETE 4+ VIEW

[x knee lat right]
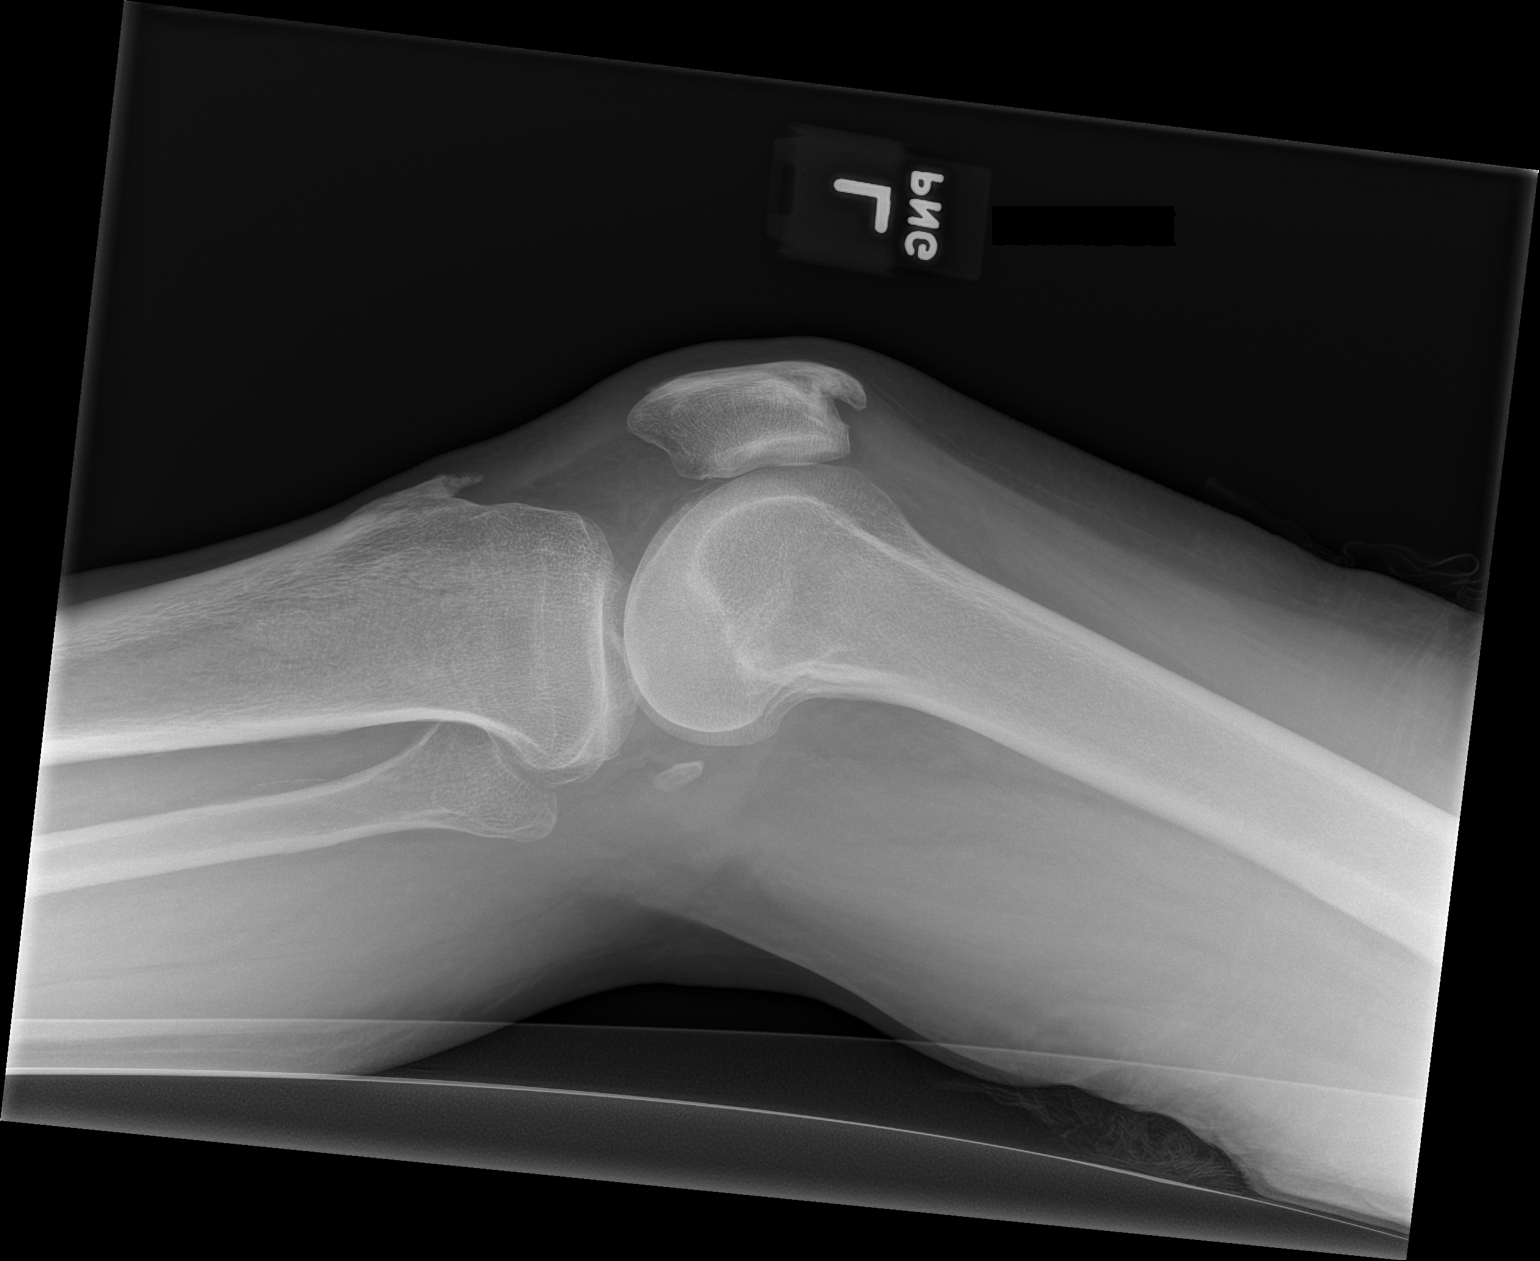

[1 of 1 positions shown; findings below may reference images not displayed]

FINDINGS: There is no fracture or dislocation or joint effusion. Small
osteophytes in the upper pole of the patella and at the anterior
tibial tubercle.
IMPRESSION: No significant abnormality.

## 2015-01-11 IMAGING — CR DG CHEST 2V
2 series · 2 of 2 positions shown · non-contrast
Comparison: 07/04/2013

CLINICAL DATA: Recent traumatic injury

EXAM:
CHEST  2 VIEW

[x chest ap]
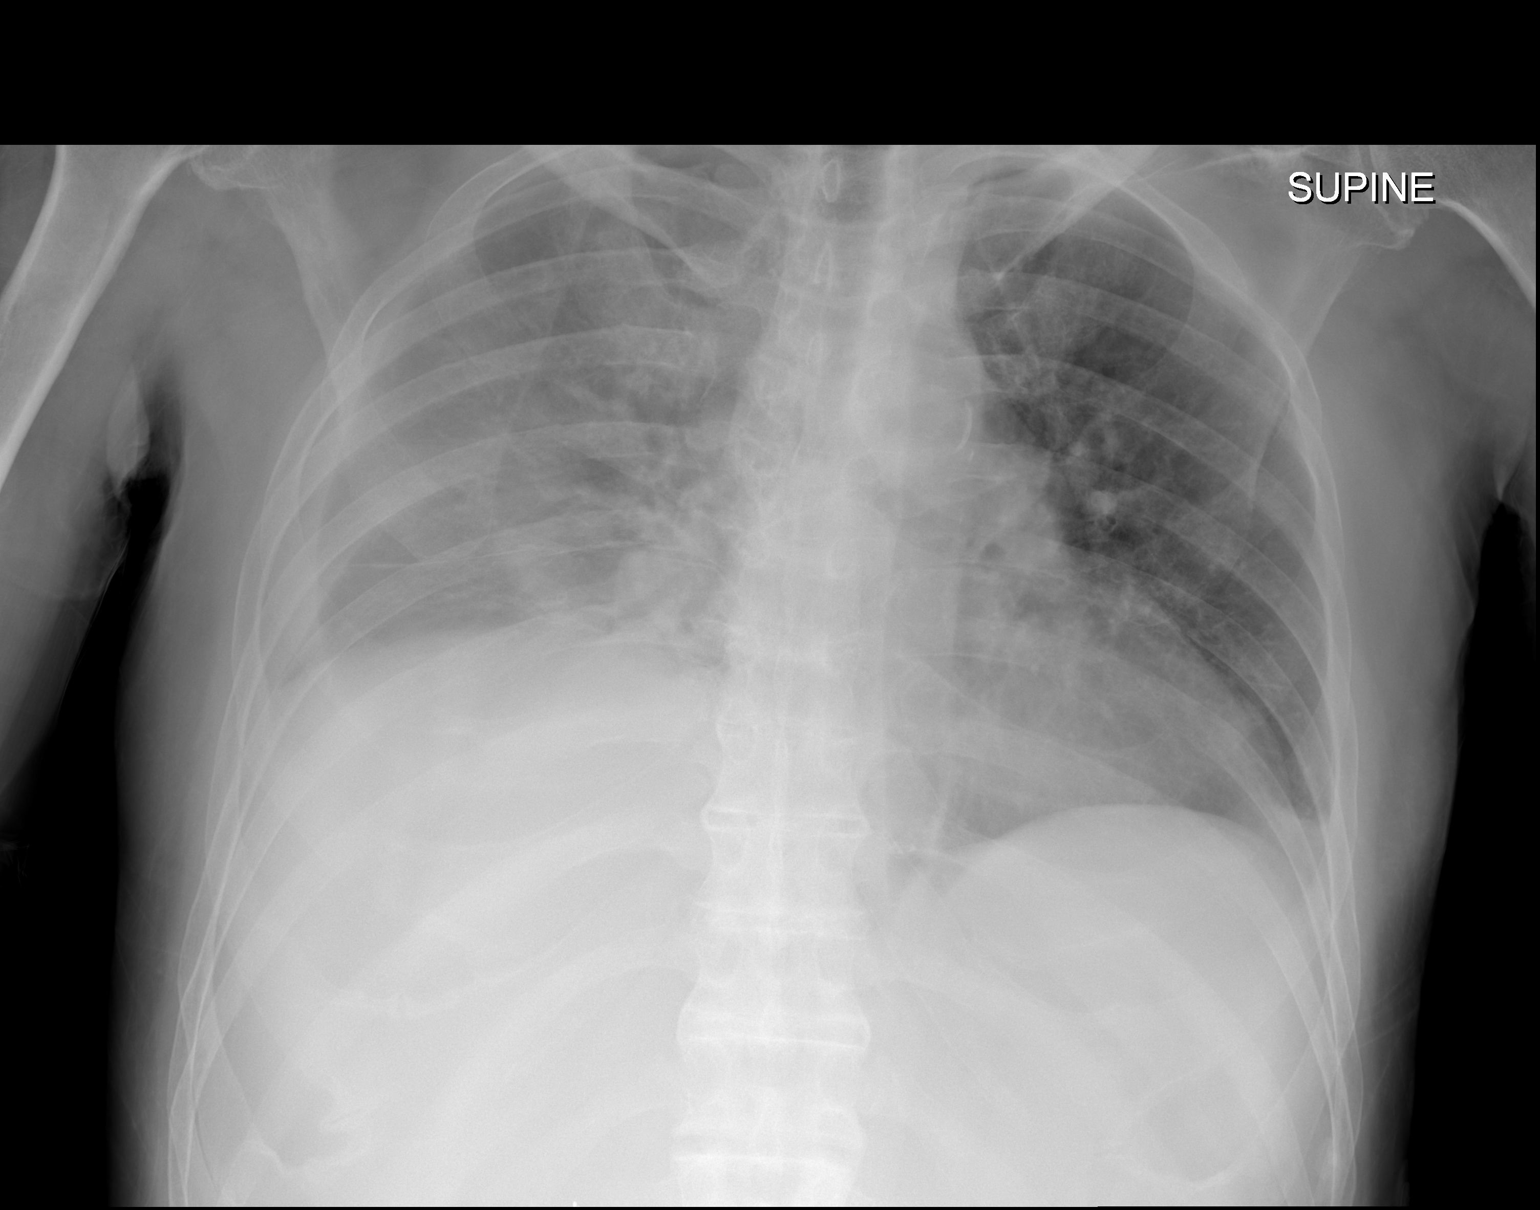

[w chest lat]
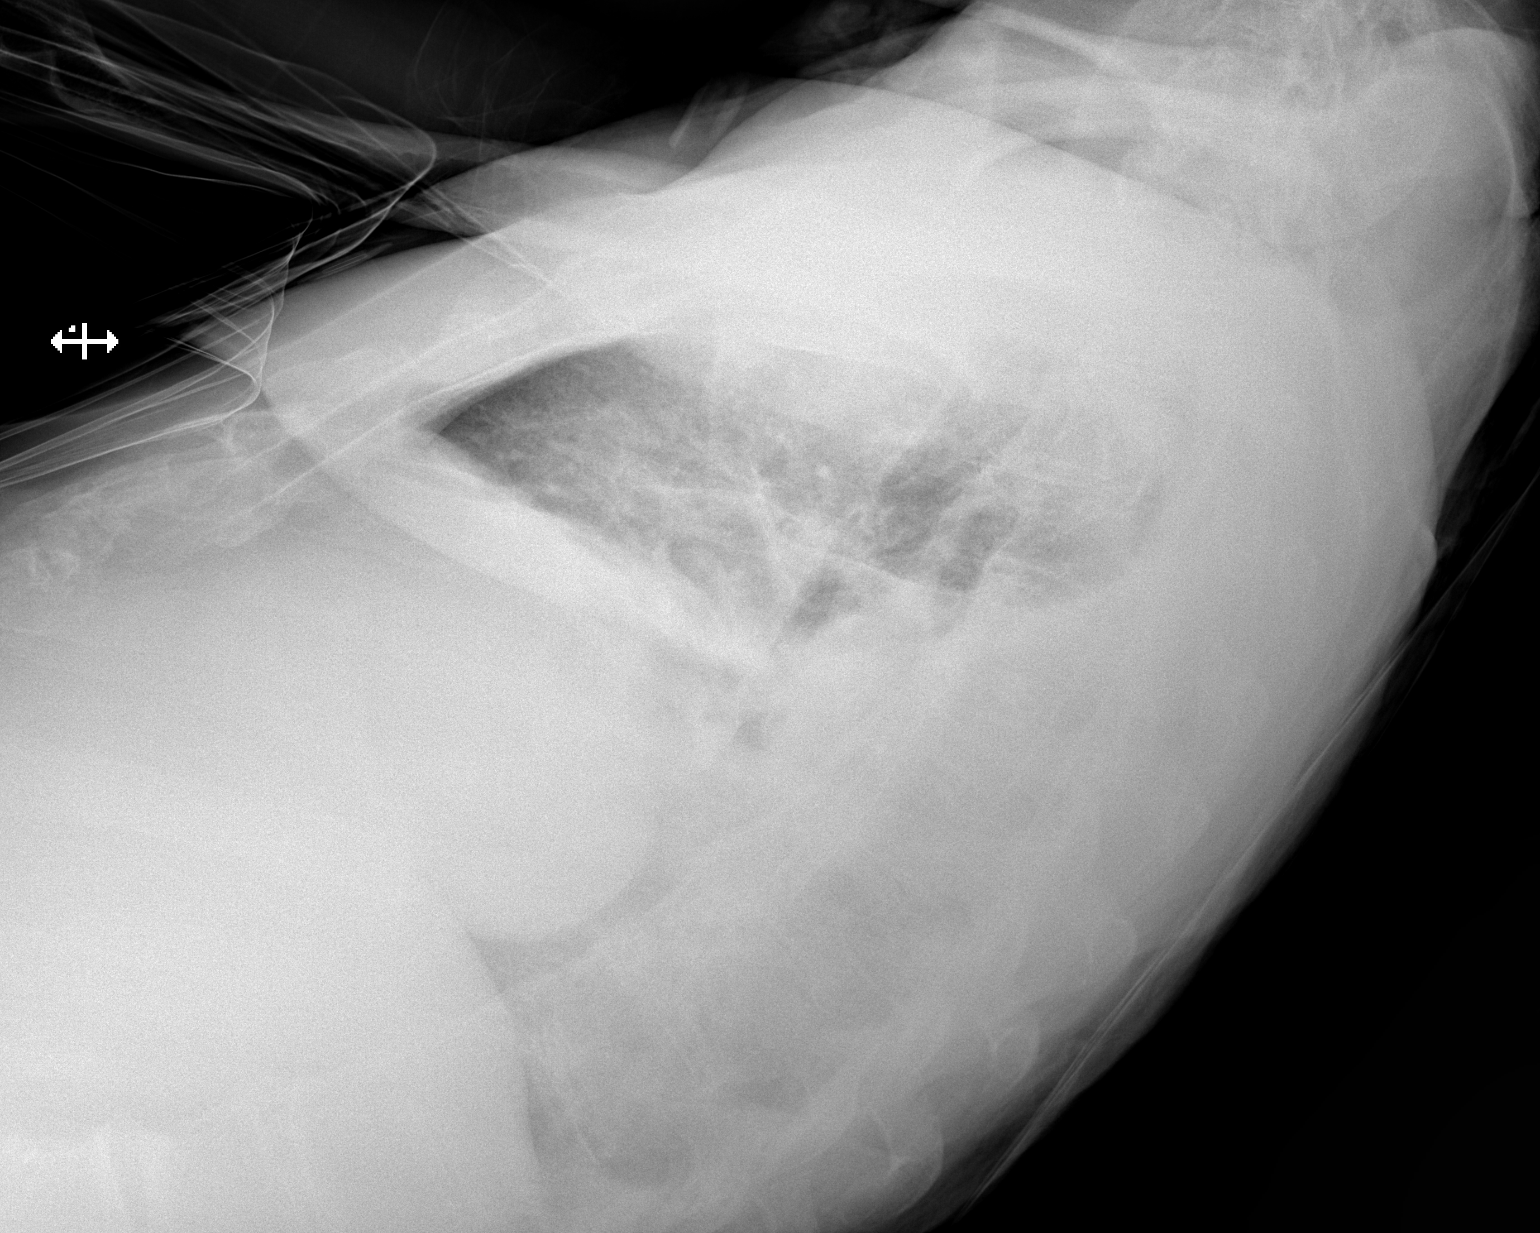

[2 of 2 positions shown; findings below may reference images not displayed]

FINDINGS: A right-sided pleural effusion is again identified similar to that
seen on the prior CT examination. The known subcarinal mass lesion
is not well appreciated on this exam. The left lung is clear. The
cardiac shadow is stable.
IMPRESSION: Stable right-sided pleural effusions secondary to known underlying
mass lesion. No acute abnormality is noted.
